# Patient Record
Sex: Female | Born: 1945 | Race: White | Hispanic: No | Marital: Married | Smoking: Former smoker
Health system: Southern US, Community
[De-identification: ages and names within clinical notes are randomized; demographics above are authoritative.]

## PROBLEM LIST (undated history)

## (undated) DIAGNOSIS — E785 Hyperlipidemia, unspecified: Secondary | ICD-10-CM

## (undated) DIAGNOSIS — M899 Disorder of bone, unspecified: Secondary | ICD-10-CM

## (undated) DIAGNOSIS — F411 Generalized anxiety disorder: Secondary | ICD-10-CM

## (undated) DIAGNOSIS — K3189 Other diseases of stomach and duodenum: Secondary | ICD-10-CM

## (undated) DIAGNOSIS — Z79899 Other long term (current) drug therapy: Secondary | ICD-10-CM

## (undated) DIAGNOSIS — M949 Disorder of cartilage, unspecified: Secondary | ICD-10-CM

## (undated) DIAGNOSIS — R030 Elevated blood-pressure reading, without diagnosis of hypertension: Secondary | ICD-10-CM

## (undated) DIAGNOSIS — R1013 Epigastric pain: Secondary | ICD-10-CM

## (undated) DIAGNOSIS — I498 Other specified cardiac arrhythmias: Secondary | ICD-10-CM

## (undated) HISTORY — DX: Hyperlipidemia, unspecified: E78.5

## (undated) HISTORY — PX: CATARACT EXTRACTION, BILATERAL: SHX1313

## (undated) HISTORY — DX: Disorder of cartilage, unspecified: M94.9

## (undated) HISTORY — DX: Disorder of bone, unspecified: M89.9

## (undated) HISTORY — DX: Other diseases of stomach and duodenum: K31.89

## (undated) HISTORY — DX: Other specified cardiac arrhythmias: I49.8

## (undated) HISTORY — DX: Other long term (current) drug therapy: Z79.899

## (undated) HISTORY — PX: TONSILLECTOMY: SUR1361

## (undated) HISTORY — DX: Epigastric pain: R10.13

## (undated) HISTORY — PX: LAPAROSCOPY FOR ECTOPIC PREGNANCY: SUR765

## (undated) HISTORY — DX: Elevated blood-pressure reading, without diagnosis of hypertension: R03.0

## (undated) HISTORY — DX: Generalized anxiety disorder: F41.1

---

## 2003-09-21 LAB — HM SIGMOIDOSCOPY: HM Sigmoidoscopy: NORMAL

## 2006-09-20 ENCOUNTER — Encounter: Payer: Self-pay | Admitting: Family Medicine

## 2006-09-20 LAB — CONVERTED CEMR LAB: Pap Smear: NORMAL

## 2006-09-21 ENCOUNTER — Ambulatory Visit: Payer: Self-pay | Admitting: Internal Medicine

## 2006-10-06 ENCOUNTER — Ambulatory Visit: Payer: Self-pay | Admitting: Family Medicine

## 2006-10-06 ENCOUNTER — Other Ambulatory Visit: Admission: RE | Admit: 2006-10-06 | Discharge: 2006-10-06 | Payer: Self-pay | Admitting: Family Medicine

## 2006-10-06 ENCOUNTER — Encounter: Payer: Self-pay | Admitting: Family Medicine

## 2006-10-10 ENCOUNTER — Ambulatory Visit: Payer: Self-pay | Admitting: Family Medicine

## 2006-10-10 LAB — CONVERTED CEMR LAB
ALT: 24 units/L (ref 0–40)
AST: 26 units/L (ref 0–37)
Albumin: 3.8 g/dL (ref 3.5–5.2)
Alkaline Phosphatase: 31 units/L — ABNORMAL LOW (ref 39–117)
BUN: 12 mg/dL (ref 6–23)
Calcium: 9.5 mg/dL (ref 8.4–10.5)
Cholesterol: 168 mg/dL (ref 0–200)
GFR calc Af Amer: 65 mL/min
GFR calc non Af Amer: 54 mL/min
Potassium: 3.4 meq/L — ABNORMAL LOW (ref 3.5–5.1)
Total Bilirubin: 0.9 mg/dL (ref 0.3–1.2)
Total Protein: 6.8 g/dL (ref 6.0–8.3)

## 2006-10-13 ENCOUNTER — Ambulatory Visit: Payer: Self-pay | Admitting: Family Medicine

## 2006-10-25 ENCOUNTER — Ambulatory Visit: Payer: Self-pay | Admitting: Family Medicine

## 2007-04-11 ENCOUNTER — Ambulatory Visit: Payer: Self-pay | Admitting: Family Medicine

## 2007-06-30 ENCOUNTER — Ambulatory Visit: Payer: Self-pay | Admitting: Family Medicine

## 2007-11-19 HISTORY — PX: CARDIOVASCULAR STRESS TEST: SHX262

## 2007-11-19 HISTORY — PX: DOPPLER ECHOCARDIOGRAPHY: SHX263

## 2007-11-21 ENCOUNTER — Telehealth: Payer: Self-pay | Admitting: Family Medicine

## 2007-11-21 ENCOUNTER — Emergency Department: Payer: Self-pay | Admitting: Emergency Medicine

## 2007-11-21 ENCOUNTER — Other Ambulatory Visit: Payer: Self-pay

## 2007-11-23 ENCOUNTER — Encounter: Payer: Self-pay | Admitting: Family Medicine

## 2007-11-24 ENCOUNTER — Encounter: Payer: Self-pay | Admitting: Family Medicine

## 2007-11-25 ENCOUNTER — Encounter: Payer: Self-pay | Admitting: Family Medicine

## 2007-11-28 ENCOUNTER — Encounter: Payer: Self-pay | Admitting: Family Medicine

## 2007-12-04 ENCOUNTER — Ambulatory Visit: Payer: Self-pay | Admitting: Family Medicine

## 2007-12-06 ENCOUNTER — Telehealth: Payer: Self-pay | Admitting: Family Medicine

## 2007-12-08 ENCOUNTER — Telehealth: Payer: Self-pay | Admitting: Family Medicine

## 2007-12-08 ENCOUNTER — Encounter: Payer: Self-pay | Admitting: Family Medicine

## 2007-12-08 DIAGNOSIS — E782 Mixed hyperlipidemia: Secondary | ICD-10-CM | POA: Insufficient documentation

## 2007-12-08 DIAGNOSIS — I10 Essential (primary) hypertension: Secondary | ICD-10-CM | POA: Insufficient documentation

## 2007-12-08 DIAGNOSIS — F411 Generalized anxiety disorder: Secondary | ICD-10-CM | POA: Insufficient documentation

## 2007-12-08 DIAGNOSIS — M8580 Other specified disorders of bone density and structure, unspecified site: Secondary | ICD-10-CM | POA: Insufficient documentation

## 2007-12-12 ENCOUNTER — Ambulatory Visit: Payer: Self-pay | Admitting: Family Medicine

## 2007-12-13 ENCOUNTER — Telehealth: Payer: Self-pay | Admitting: Family Medicine

## 2007-12-15 ENCOUNTER — Encounter: Payer: Self-pay | Admitting: Family Medicine

## 2007-12-21 LAB — CONVERTED CEMR LAB
Dopamine 24 Hr Urine: 114 mcg/24hr (ref <500)
Metaneph Total, Ur: 366 ug/24hr (ref 224–832)
Metanephrines, Ur: 117 (ref 90–315)
Norepinephrine 24 Hr Urine: 48 mcg/24hr (ref <80)
Normetanephrine, 24H Ur: 249 (ref 122–676)

## 2007-12-25 ENCOUNTER — Ambulatory Visit: Payer: Self-pay | Admitting: Family Medicine

## 2007-12-25 LAB — CONVERTED CEMR LAB: LDL Cholesterol: 100 mg/dL

## 2007-12-27 ENCOUNTER — Telehealth: Payer: Self-pay | Admitting: Family Medicine

## 2008-01-25 ENCOUNTER — Encounter: Payer: Self-pay | Admitting: Family Medicine

## 2008-02-13 ENCOUNTER — Ambulatory Visit: Payer: Self-pay | Admitting: Family Medicine

## 2008-02-14 ENCOUNTER — Telehealth: Payer: Self-pay | Admitting: Family Medicine

## 2008-02-14 ENCOUNTER — Encounter: Payer: Self-pay | Admitting: Family Medicine

## 2008-02-14 LAB — CONVERTED CEMR LAB
Albumin: 4.6 g/dL (ref 3.5–5.2)
Basophils Absolute: 0 10*3/uL (ref 0.0–0.1)
CO2: 26 meq/L (ref 19–32)
Calcium: 9.9 mg/dL (ref 8.4–10.5)
Creatinine, Ser: 0.83 mg/dL (ref 0.40–1.20)
Eosinophils Relative: 0 % (ref 0–5)
Glucose, Bld: 89 mg/dL (ref 70–99)
HCT: 42.5 % (ref 36.0–46.0)
Hemoglobin: 14.4 g/dL (ref 12.0–15.0)
Lymphocytes Relative: 10 % — ABNORMAL LOW (ref 12–46)
Lymphs Abs: 0.8 10*3/uL (ref 0.7–4.0)
MCV: 83 fL (ref 78.0–100.0)
Monocytes Absolute: 0.5 10*3/uL (ref 0.1–1.0)
RDW: 13.2 % (ref 11.5–15.5)
Total Protein: 7.5 g/dL (ref 6.0–8.3)

## 2008-02-15 ENCOUNTER — Encounter: Payer: Self-pay | Admitting: Family Medicine

## 2008-02-21 ENCOUNTER — Telehealth: Payer: Self-pay | Admitting: Family Medicine

## 2008-02-26 ENCOUNTER — Ambulatory Visit: Payer: Self-pay | Admitting: Family Medicine

## 2008-02-28 LAB — CONVERTED CEMR LAB
Folate: 16.1 ng/mL
Vitamin B-12: 238 pg/mL (ref 211–911)

## 2008-03-13 ENCOUNTER — Telehealth: Payer: Self-pay | Admitting: Family Medicine

## 2008-03-13 ENCOUNTER — Encounter: Payer: Self-pay | Admitting: Family Medicine

## 2008-03-13 ENCOUNTER — Telehealth (INDEPENDENT_AMBULATORY_CARE_PROVIDER_SITE_OTHER): Payer: Self-pay | Admitting: *Deleted

## 2008-03-14 ENCOUNTER — Ambulatory Visit: Payer: Self-pay | Admitting: Family Medicine

## 2008-03-14 DIAGNOSIS — E538 Deficiency of other specified B group vitamins: Secondary | ICD-10-CM | POA: Insufficient documentation

## 2008-03-19 ENCOUNTER — Encounter (INDEPENDENT_AMBULATORY_CARE_PROVIDER_SITE_OTHER): Payer: Self-pay | Admitting: *Deleted

## 2008-04-17 ENCOUNTER — Ambulatory Visit: Payer: Self-pay | Admitting: Family Medicine

## 2008-05-21 ENCOUNTER — Ambulatory Visit: Payer: Self-pay | Admitting: Family Medicine

## 2008-06-19 ENCOUNTER — Ambulatory Visit: Payer: Self-pay | Admitting: Family Medicine

## 2008-06-24 ENCOUNTER — Ambulatory Visit: Payer: Self-pay | Admitting: Family Medicine

## 2008-07-29 ENCOUNTER — Ambulatory Visit: Payer: Self-pay | Admitting: Family Medicine

## 2008-10-02 ENCOUNTER — Ambulatory Visit: Payer: Self-pay | Admitting: Family Medicine

## 2009-02-10 ENCOUNTER — Telehealth: Payer: Self-pay | Admitting: Family Medicine

## 2009-04-25 ENCOUNTER — Other Ambulatory Visit: Admission: RE | Admit: 2009-04-25 | Discharge: 2009-04-25 | Payer: Self-pay | Admitting: Family Medicine

## 2009-04-25 ENCOUNTER — Ambulatory Visit: Payer: Self-pay | Admitting: Family Medicine

## 2009-04-29 ENCOUNTER — Ambulatory Visit: Payer: Self-pay | Admitting: Family Medicine

## 2009-05-02 ENCOUNTER — Ambulatory Visit: Payer: Self-pay | Admitting: Family Medicine

## 2009-05-05 ENCOUNTER — Encounter (INDEPENDENT_AMBULATORY_CARE_PROVIDER_SITE_OTHER): Payer: Self-pay | Admitting: *Deleted

## 2009-05-06 LAB — CONVERTED CEMR LAB
ALT: 16 units/L (ref 0–35)
Alkaline Phosphatase: 20 units/L — ABNORMAL LOW (ref 39–117)
Basophils Absolute: 0 10*3/uL (ref 0.0–0.1)
Basophils Relative: 0 % (ref 0–1)
Glucose, Bld: 95 mg/dL (ref 70–99)
LDL Cholesterol: 135 mg/dL — ABNORMAL HIGH (ref 0–99)
MCHC: 33 g/dL (ref 30.0–36.0)
Neutro Abs: 2.8 10*3/uL (ref 1.7–7.7)
Neutrophils Relative %: 57 % (ref 43–77)
RDW: 13.9 % (ref 11.5–15.5)
Sodium: 143 meq/L (ref 135–145)
Total Bilirubin: 0.8 mg/dL (ref 0.3–1.2)
Total CHOL/HDL Ratio: 3.9
Total Protein: 7 g/dL (ref 6.0–8.3)
Triglycerides: 158 mg/dL — ABNORMAL HIGH (ref <150)
VLDL: 32 mg/dL (ref 0–40)
Vitamin B-12: 898 pg/mL (ref 211–911)

## 2009-05-07 ENCOUNTER — Encounter (INDEPENDENT_AMBULATORY_CARE_PROVIDER_SITE_OTHER): Payer: Self-pay | Admitting: *Deleted

## 2009-05-14 LAB — HM MAMMOGRAPHY: HM Mammogram: NORMAL

## 2010-05-26 ENCOUNTER — Telehealth (INDEPENDENT_AMBULATORY_CARE_PROVIDER_SITE_OTHER): Payer: Self-pay | Admitting: *Deleted

## 2010-05-26 ENCOUNTER — Ambulatory Visit: Payer: Self-pay | Admitting: Family Medicine

## 2010-05-26 LAB — CONVERTED CEMR LAB
ALT: 12 units/L (ref 0–35)
AST: 21 units/L (ref 0–37)
Alkaline Phosphatase: 20 units/L — ABNORMAL LOW (ref 39–117)
BUN: 15 mg/dL (ref 6–23)
Basophils Absolute: 0 10*3/uL (ref 0.0–0.1)
Basophils Relative: 0 % (ref 0–1)
Creatinine, Ser: 0.87 mg/dL (ref 0.40–1.20)
Eosinophils Absolute: 0.1 10*3/uL (ref 0.0–0.7)
Eosinophils Relative: 2 % (ref 0–5)
HCT: 40.4 % (ref 36.0–46.0)
HDL: 54 mg/dL (ref >39)
Hemoglobin: 13.4 g/dL (ref 12.0–15.0)
MCHC: 33.2 g/dL (ref 30.0–36.0)
MCV: 84 fL (ref 78.0–100.0)
Monocytes Absolute: 0.5 10*3/uL (ref 0.1–1.0)
Neutro Abs: 3.1 10*3/uL (ref 1.7–7.7)
RDW: 13.6 % (ref 11.5–15.5)
TSH: 2.102 microintl units/mL (ref 0.350–4.500)
Total Bilirubin: 1 mg/dL (ref 0.3–1.2)
Total CHOL/HDL Ratio: 3.3
VLDL: 26 mg/dL (ref 0–40)
Vitamin B-12: 698 pg/mL (ref 211–911)

## 2010-05-29 ENCOUNTER — Ambulatory Visit: Payer: Self-pay | Admitting: Family Medicine

## 2010-05-29 LAB — HM PAP SMEAR

## 2010-09-01 ENCOUNTER — Ambulatory Visit: Payer: Self-pay | Admitting: Unknown Physician Specialty

## 2010-09-01 ENCOUNTER — Encounter: Payer: Self-pay | Admitting: Family Medicine

## 2010-09-02 LAB — PATHOLOGY REPORT

## 2010-10-20 NOTE — Progress Notes (Signed)
----   Converted from flag ---- ---- 05/23/2010 11:39 PM, Kerby Nora MD wrote: CMET, lipids, cbc, vit B12, TSh Dx 401.1, 780.79  ---- 05/21/2010 10:26 AM, Liane Comber CMA (AAMA) wrote: Peri [facility] Morning! This pt is scheduled for cpx labs Tuesday, which labs to draw and dx codes to use? Thanks Tasha ------------------------------

## 2010-10-20 NOTE — Assessment & Plan Note (Signed)
Summary: CPX/RBH   Vital Signs:  Patient profile:   65 year old female Height:      64 inches Weight:      155 pounds BMI:     26.70 Temp:     98.6 degrees F oral Pulse rate:   76 / minute Pulse rhythm:   regular BP sitting:   150 / 84  (left arm) Cuff size:   regular  Vitals Entered By: Lowella Petties CMA (May 29, 2010 2:04 PM) CC: Check up, Hypertension Management     Last PAP Date 05/29/2010 Last PAP Result DVE only, PAP q2-3 years   History of Present Illness: The patient is here for annual wellness exam and preventative care.     She has the following chronic issues:  HTN:On lisinopril /HCTZ .Marland Kitchenat home BPs...running 120/70s.  She states she is anxious today.  High cholesterol, much improved since last OV. Eating right and regualr exercise. Taking simvastin daily. Sister with CAD at young age 39s.  Anxiety/depression: Has been able to wean off sertraline in 04/2010. HAs appt with pshychiatry next month. HAs been doing okaty off this med..some quick to anger issues with husband.    Hypertension History:      Positive major cardiovascular risk factors include female age 64 years old or older, hyperlipidemia, and hypertension.  Negative major cardiovascular risk factors include non-tobacco-user status.     Current Medications (verified): 1)  Aspirin Childrens 81 Mg Chew (Aspirin) .... Take 1 Tablet By Mouth Once A Day 2)  Simvastatin 40 Mg Tabs (Simvastatin) .... Take 1 Tablet By Mouth At Bedtime 3)  Zestoretic 20-12.5 Mg Tabs (Lisinopril-Hydrochlorothiazide) .... Take 2 Tablets By Mouth Once Daily 4)  Vitamin B-12 2500 Mcg Subl (Cyanocobalamin) .... Take One By Mouth Daily 5)  Fish Oil 1000 Mg Caps (Omega-3 Fatty Acids) .... Take One By Mouth Daily 6)  Oscal 500/200 D-3 500-200 Mg-Unit Tabs (Calcium-Vitamin D) .... Take One By Mouth Daily  Allergies: No Known Drug Allergies  Review of Systems General:  Denies fatigue and fever. CV:  Denies chest pain or  discomfort. Resp:  Denies shortness of breath. GI:  Denies abdominal pain, bloody stools, constipation, and diarrhea. GU:  Denies abnormal vaginal bleeding and dysuria. Derm:  Denies lesion(s).  Physical Exam  General:  Well-developed,well-nourished,in no acute distress; alert,appropriate and cooperative throughout examination Eyes:  No corneal or conjunctival inflammation noted. EOMI. Perrla. Funduscopic exam benign, without hemorrhages, exudates or papilledema. Vision grossly normal. Ears:  External ear exam shows no significant lesions or deformities.  Otoscopic examination reveals clear canals, tympanic membranes are intact bilaterally without bulging, retraction, inflammation or discharge. Hearing is grossly normal bilaterally. Nose:  External nasal examination shows no deformity or inflammation. Nasal mucosa are pink and moist without lesions or exudates. Mouth:  Oral mucosa and oropharynx without lesions or exudates.  Teeth in good repair. Neck:  no carotid bruit or thyromegaly no cervical or supraclavicular lymphadenopathy  Chest Wall:  No deformities, masses, or tenderness noted. Breasts:  No mass, nodules, thickening, tenderness, bulging, retraction, inflamation, nipple discharge or skin changes noted.   Lungs:  Normal respiratory effort, chest expands symmetrically. Lungs are clear to auscultation, no crackles or wheezes. Heart:  Normal rate and regular rhythm. S1 and S2 normal without gallop, murmur, click, rub or other extra sounds. Abdomen:  Bowel sounds positive,abdomen soft and non-tender without masses, organomegaly or hernias noted. Genitalia:  Pelvic Exam:        External: normal female genitalia without  lesions or masses        Vagina: normal without lesions or masses        Cervix: normal without lesions or masses        Adnexa: normal bimanual exam without masses or fullness        Uterus: normal by palpation        Pap smear: not performed Msk:  No deformity or  scoliosis noted of thoracic or lumbar spine.   Pulses:  R and L posterior tibial pulses are full and equal bilaterally  Extremities:  no edema Neurologic:  No cranial nerve deficits noted. Station and gait are normal. Plantar reflexes are down-going bilaterally. DTRs are symmetrical throughout. Sensory, motor and coordinative functions appear intact. Skin:  Intact without suspicious lesions or rashes Psych:  Oriented X3, memory intact for recent and remote, normally interactive, good eye contact, and slightly anxious.     Impression & Recommendations:  Problem # 1:  WELL WOMAN (ICD-V70.0) The patient's preventative maintenance and recommended screening tests for an annual wellness exam were reviewed in full today. Brought up to date unless services declined.  Counselled on the importance of diet, exercise, and its role in overall health and mortality. The patient's FH and SH was reviewed, including their home life, tobacco status, and drug and alcohol status.     Problem # 2:  ROUTINE GYNECOLOGICAL EXAMINATION (ICD-V72.31) DVE nml , PAP every 2-3 years..none this year.   Problem # 3:  HYPERTENSION (ICD-401.9)  Well controlled at home...has white coat HTN elevations...continue current med. Follow at home.  Her updated medication list for this problem includes:    Zestoretic 20-12.5 Mg Tabs (Lisinopril-hydrochlorothiazide) .Marland Kitchen... Take 2 tablets by mouth once daily  BP today: 150/84 Prior BP: 126/82 (04/25/2009)  10 Yr Risk Heart Disease: 9 %  Labs Reviewed: K+: 4.1 (04/29/2009) Creat: : 1.00 (04/29/2009)   Chol: 224 (04/29/2009)   HDL: 54 (05/29/2010)   LDL: 96 (05/29/2010)   TG: 158 (04/29/2009)  Problem # 4:  OSTEOPENIA (ICD-733.90) Due for repeat DXA.  The following medications were removed from the medication list:    Caltrate 600+d Plus 600-400 Mg-unit Tabs (Calcium carbonate-vit d-min) .Marland Kitchen... Take 1 tablet by mouth once a day Her updated medication list for this problem  includes:    Oscal 500/200 D-3 500-200 Mg-unit Tabs (Calcium-vitamin d) .Marland Kitchen... Take one by mouth daily  Orders: Radiology Referral (Radiology)  Problem # 5:  HYPERLIPIDEMIA (ICD-272.4)  Well controlled. Continue current medication.  Her updated medication list for this problem includes:    Simvastatin 40 Mg Tabs (Simvastatin) .Marland Kitchen... Take 1 tablet by mouth at bedtime  Labs Reviewed: SGOT: 17 (04/29/2009)   SGPT: 16 (04/29/2009)  10 Yr Risk Heart Disease: 9 %   HDL:54 (05/29/2010), 57 (04/29/2009)  LDL:96 (05/29/2010), 135 (16/06/9603)  Chol:224 (04/29/2009), 175 (12/25/2007)  Trig:158 (04/29/2009), 100 (12/25/2007)  Problem # 6:  ANXIETY (ICD-300.00) Stable control off medicaiton. Sees Pshychiatry. The following medications were removed from the medication list:    Sertraline Hcl 100 Mg Tabs (Sertraline hcl) .Marland Kitchen... Takes 150 mg. once daily  Complete Medication List: 1)  Aspirin Childrens 81 Mg Chew (Aspirin) .... Take 1 tablet by mouth once a day 2)  Simvastatin 40 Mg Tabs (Simvastatin) .... Take 1 tablet by mouth at bedtime 3)  Zestoretic 20-12.5 Mg Tabs (Lisinopril-hydrochlorothiazide) .... Take 2 tablets by mouth once daily 4)  Vitamin B-12 2500 Mcg Subl (Cyanocobalamin) .... Take one by mouth daily 5)  Fish Oil 1000  Mg Caps (Omega-3 fatty acids) .... Take one by mouth daily 6)  Oscal 500/200 D-3 500-200 Mg-unit Tabs (Calcium-vitamin d) .... Take one by mouth daily  Other Orders: Admin 1st Vaccine (16109) Flu Vaccine 90yrs + [phone]) Gastroenterology Referral (GI)  Hypertension Assessment/Plan:      The patient's hypertensive risk group is category B: At least one risk factor (excluding diabetes) with no target organ damage.  Her calculated 10 year risk of coronary heart disease is 9 %.  Today's blood pressure is 150/84.    Patient Instructions: 1)  Referral Appointment Information 2)  Day/Date: 3)  Time: 4)  Place/MD: 5)  Address: 6)  Phone/Fax: 7)  Patient given  appointment information. Information/Orders faxed/mailed.  8)  Please schedule a follow-up appointment in 1 year.   Prior Medications (reviewed today): ASPIRIN CHILDRENS 81 MG CHEW (ASPIRIN) Take 1 tablet by mouth once a day SIMVASTATIN 40 MG TABS (SIMVASTATIN) Take 1 tablet by mouth at bedtime ZESTORETIC 20-12.5 MG TABS (LISINOPRIL-HYDROCHLOROTHIAZIDE) Take 2 tablets by mouth once daily VITAMIN B-12 2500 MCG SUBL (CYANOCOBALAMIN) take one by mouth daily FISH OIL 1000 MG CAPS (OMEGA-3 FATTY ACIDS) take one by mouth daily OSCAL 500/200 D-3 500-200 MG-UNIT TABS (CALCIUM-VITAMIN D) take one by mouth daily Current Allergies: No known allergies   Herpes Zoster Next Due:  Refused Last HDL:  57 (04/29/2009 10:04:00 PM) HDL Result Date:  05/29/2010 HDL Result:  54 HDL Next Due:  1 yr Last LDL:  135 (04/29/2009 10:04:00 PM) LDL Result Date:  05/29/2010 LDL Result:  96 LDL Next Due:  1 yr Last Hemoccult Result: normal (05/05/2009 2:55:20 PM) Hemoccult Next Due:  Not Indicated Last PAP:  normal (04/25/2009 5:48:58 PM) PAP Result Date:  05/29/2010 PAP Result:  DVE only, PAP q2-3 years PAP Next Due:  1 yr   Flu Vaccine Consent Questions     Do you have a history of severe allergic reactions to this vaccine? no    Any prior history of allergic reactions to egg and/or gelatin? no    Do you have a sensitivity to the preservative Thimersol? no    Do you have a past history of Guillan-Barre Syndrome? no    Do you currently have an acute febrile illness? no    Have you ever had a severe reaction to latex? no    Vaccine information given and explained to patient? yes    Are you currently pregnant? no    Lot Number:[id number]   Exp Date:03/20/2011   Site Given  Left Deltoid IM  PAP Result Date:  05/29/2010 PAP Result:  DVE only, PAP q2-3 years PAP Next Due:  1 yr   .lbflu

## 2010-10-22 NOTE — Procedures (Signed)
Summary: Colonoscopy by Dr.Robert [facility]  Colonoscopy by Dr.Robert [facility]   Imported By: Beau Fanny 09/04/2010 16:33:59  _____________________________________________________________________  External Attachment:    Type:   Image     Comment:   External Document  Appended Document: Orders Update    Clinical Lists Changes  Observations: Added new observation of LST COLON DT: 09/04/2010 (09/04/2010 16:48) Added new observation of COLONNXTDUE: 09/04/2020 (09/04/2010 16:48) Added new observation of COLONOSCOPY: awating path, polyps (09/04/2010 16:48)      Colonoscopy Result Date:  09/04/2010 Colonoscopy Result:  awating path, polyps

## 2011-02-02 NOTE — Assessment & Plan Note (Signed)
[facility]                                 ON-CALL NOTE   Tammy Schmidt, Tammy Schmidt                      MRN:          [id number]  DATE:02/25/2008                            DOB:          [birth year]    PHONE NUMBER:  [phone].   Regular doctor is Dr. Ermalene Searing and Dr. Milinda Antis on-call.   CHIEF COMPLAINT:  Insomnia and anxiety.   The patient states she is on 5 mg of Ambien, and it is not helping her  sleep much.  She cannot even take naps during the day.  She has been  diagnosed with anxiety.  She is taking some Zoloft as well.  She has had  ongoing medical issues lately, including some palpitations for which she  has seen a cardiologist at [facility].  For the past 2 weeks, she has  had some numbness in her arm and her leg, which she does not think is  emergent, but is not sure what is causing that.  She thinks something  medical may be causing her anxiety, but she is not sure.  She wondered  if calling this number could get her a sooner appointment with Dr.  Ermalene Searing.  I told her tonight if she wanted to try taking 10 mg  instead  of 5 mg of Ambien, she could try it once and be very cautious as this is  sedating and can make her dizzy.  I encouraged her to seek care if her  numbness in her leg or arm returns or worsens, or if she develops severe  headache or any other symptoms.  She also mentions that her appetite is  very low, and she is losing weight.  I encouraged her to try to eat as  healthy as possible and to call the office first thing in the morning  when it opens to get an appointment with Dr. Ermalene Searing to discuss these  issues.  In the meantime, if her symptoms worsen or if she becomes much  more anxious, she is going to go to [facility] for evaluation in the ER.     Tammy A. Tower, MD  Electronically Signed    MAT/MedQ  DD: 02/25/2008  DT: 02/26/2008  Job #: [id number]

## 2011-02-05 NOTE — Assessment & Plan Note (Signed)
[facility]                           [place] OFFICE NOTE   Tammy Schmidt, Tammy Schmidt                      MRN:          [id number]  DATE:09/21/2006                            DOB:          25-Jul-1946    NEW PATIENT HISTORY AND PHYSICAL   CHIEF COMPLAINT:  A 65 year old white female here to establish new  doctor.   HISTORY OF PRESENT ILLNESS:  Tammy Schmidt recently moved approximately 1  month ago from [place].  She is here to be set up with a new doctor and  address the following chronic problems:  1. Hypertension, well-controlled:  She states that her blood pressure      is well controlled at home.  She brings a blood pressure recording      of the measurements, showing blood pressures ranging from 119-      131/64-77.  She states that her blood pressure is always elevated      at doctors' visits.  She has done a home blood pressure monitor      that has diagnosed her with classic white coat hypertension.  She      continues to take Hyzaar 100/25 mg daily to keep her blood pressure      well controlled. She is requesting a refill of Hyzaar today.  2. Hypercholesterolemia:  She states this has been checked in the last      3-4 months.  At that time, she states her bad cholesterol was      slightly elevated and so she was increased on her dose of      simvastatin from 40 to 80 mg.  She states that she began having      body aches and so went back down to the 40 mg and has been doing      fine.  She is unsure of what her exact cholesterol measurements are      at this time.  She is open to trying additional cholesterol      medications if needed.  3. Anxiety:  She states that she has been on sertraline for several      years but her anxiety has gotten recently worse secondary to her      husband losing his job and to the recent move.  Her previous doctor      did increase her sertraline from 100 to 150 mg daily at the last      visit 3-4 months  ago.  She states that she has had some improvement      but still continues to have off and on anxiety.  She is requesting      a refill today.   PAST MEDICAL HISTORY:  1. Hypertension.  2. White coat syndrome.  3. Hypercholesterolemia.  4. Anxiety.   HOSPITALIZATIONS, SURGERIES, PROCEDURES:  1. A 24-hour blood pressure monitor.  2. 1980s, exploratory laparotomy for ectopic pregnancy and removal of      fallopian tube.  3. Tonsillectomy.  4. Mammogram, January 2007.  5. Pap smear, December 2006.  6. Flexible sigmoidoscopy negative in 2005.   ALLERGIES:  None.   MEDICATIONS:  1. Hyzaar 100/25 one p.o. daily.  2. Simvastatin 80 mg 1/2 tablet p.o. daily.  3. Sertraline 100 mg 1-1/2 tablets p.o. daily.  4. Caltrate 600 mg with vitamin D 2 tablets daily.  5. Omega-3 fatty acids 1200 mg daily.  6. Aspirin 81 mg daily.   FAMILY HISTORY:  Father deceased at age 39 with MI.  Mother deceased at  age 56 with MI.  She has 1 brother with hypertension and high  cholesterol.  She has sisters, 1 of whom at age 45 had coronary artery  disease and stents.  Another sister passed away at age 6 from sudden  death with diabetes, hypertension, and likely MI.  She also had 1 other  sister who passed away from Hodgkin's disease.  There is no other cancer  that runs in the family.   SOCIAL HISTORY:  She is a retired Film/video editor.  She has been married  for 42 years, happily.  She has 1 child who has increased cholesterol  but no other health problems.  She is trying to restart walking  regularly.  Her exercise has dwindled recently secondary to the move and  recent stress but she is trying to get back in to it.  She does get some  fruits and vegetables and is working on improving her diet.  She tries  to get water.   PHYSICAL EXAM:  VITAL SIGNS:  Height 64-1/2 inches.  Weight 156, making  BMI 26.  Blood pressure 176/90.  Pulse 84.  Temperature 98.2.  GENERAL:  Healthy appearing, thin female in  no apparent distress.  HEENT:  PERRLA.  Extraocular muscles intact.  Oropharynx clear.  Tympanic membranes clear.  Nares clear.  No thyromegaly.  No  lymphadenopathy, supraclavicular or cervical.  No carotid bruit.  CARDIOVASCULAR:  Regular rate and rhythm.  No murmurs, rubs, or gallops.  Normal PMI.  2+ peripheral pulses.  LUNGS:  Clear to auscultation bilaterally.  No wheezes, rales, or  rhonchi.  ABDOMEN:  Soft, nontender, normoactive bowel sounds.  No  hepatosplenomegaly.  MUSCULOSKELETAL:  Strength 5/5 in the upper and lower extremities.  NEURO:  Cranial nerves II through XII grossly intact.  Sensation intact  in upper and lower extremities.  Reflexes, patellar 2+.   ASSESSMENT AND PLAN:  1. Hypertension, well controlled:  She was given a refill of Hyzaar      100/25 mg p.o. daily.  She does likely have white coat syndrome.  I      will verify this with her records.  2. High cholesterol:  I will obtain her last cholesterol measurement      to determine how elevated it was.  We can always consider trying      Zetia if needed to improve her cholesterol without increasing      simvastatin again.  3. Anxiety, moderate control:  She will continue on sertraline 150 mg      daily.  She was given a refill of this.  She is not interested in      any counseling at this point in time.  4. Prevention:  She is due for a mammogram and this will be scheduled.      She was also      scheduled for a Pap smear and breast examination.  She is up to      date with colon cancer screening as well as vaccines.     Tammy Nora, MD  Electronically Signed  AB/MedQ  DD: 09/21/2006  DT: 09/21/2006  Job #: [id number]

## 2011-06-07 ENCOUNTER — Telehealth: Payer: Self-pay | Admitting: Family Medicine

## 2011-06-07 ENCOUNTER — Other Ambulatory Visit (INDEPENDENT_AMBULATORY_CARE_PROVIDER_SITE_OTHER): Payer: PRIVATE HEALTH INSURANCE

## 2011-06-07 DIAGNOSIS — M949 Disorder of cartilage, unspecified: Secondary | ICD-10-CM

## 2011-06-07 DIAGNOSIS — Z0000 Encounter for general adult medical examination without abnormal findings: Secondary | ICD-10-CM

## 2011-06-07 DIAGNOSIS — M899 Disorder of bone, unspecified: Secondary | ICD-10-CM

## 2011-06-07 LAB — CBC WITH DIFFERENTIAL/PLATELET
Basophils Absolute: 0 10*3/uL (ref 0.0–0.1)
Eosinophils Relative: 1 % (ref 0.0–5.0)
HCT: 40.4 % (ref 36.0–46.0)
Lymphocytes Relative: 25.1 % (ref 12.0–46.0)
Monocytes Relative: 7.2 % (ref 3.0–12.0)
Neutrophils Relative %: 66.3 % (ref 43.0–77.0)
Platelets: 214 10*3/uL (ref 150.0–400.0)
RDW: 14 % (ref 11.5–14.6)
WBC: 6.3 10*3/uL (ref 4.5–10.5)

## 2011-06-07 LAB — BASIC METABOLIC PANEL
CO2: 29 mEq/L (ref 19–32)
Chloride: 106 mEq/L (ref 96–112)
Glucose, Bld: 99 mg/dL (ref 70–99)
Potassium: 3.9 mEq/L (ref 3.5–5.1)
Sodium: 142 mEq/L (ref 135–145)

## 2011-06-07 LAB — HEPATIC FUNCTION PANEL
AST: 22 U/L (ref 0–37)
Albumin: 4.1 g/dL (ref 3.5–5.2)
Alkaline Phosphatase: 21 U/L — ABNORMAL LOW (ref 39–117)
Bilirubin, Direct: 0.1 mg/dL (ref 0.0–0.3)
Total Protein: 7 g/dL (ref 6.0–8.3)

## 2011-06-07 LAB — LIPID PANEL
LDL Cholesterol: 78 mg/dL (ref 0–99)
Total CHOL/HDL Ratio: 3

## 2011-06-07 NOTE — Telephone Encounter (Signed)
I t appears labs have already ben place for this pt... Let me know if I need to add anything else.

## 2011-06-08 ENCOUNTER — Encounter: Payer: Self-pay | Admitting: Family Medicine

## 2011-06-08 LAB — VITAMIN D 25 HYDROXY (VIT D DEFICIENCY, FRACTURES): Vit D, 25-Hydroxy: 50 ng/mL (ref 30–89)

## 2011-06-10 ENCOUNTER — Encounter: Payer: Self-pay | Admitting: Family Medicine

## 2011-06-10 ENCOUNTER — Ambulatory Visit (INDEPENDENT_AMBULATORY_CARE_PROVIDER_SITE_OTHER): Payer: PRIVATE HEALTH INSURANCE | Admitting: Family Medicine

## 2011-06-10 VITALS — BP 130/80 | HR 81 | Temp 98.4°F | Ht 65.5 in | Wt 161.8 lb

## 2011-06-10 DIAGNOSIS — F411 Generalized anxiety disorder: Secondary | ICD-10-CM

## 2011-06-10 DIAGNOSIS — M899 Disorder of bone, unspecified: Secondary | ICD-10-CM

## 2011-06-10 DIAGNOSIS — Z23 Encounter for immunization: Secondary | ICD-10-CM

## 2011-06-10 DIAGNOSIS — M949 Disorder of cartilage, unspecified: Secondary | ICD-10-CM

## 2011-06-10 DIAGNOSIS — Z1231 Encounter for screening mammogram for malignant neoplasm of breast: Secondary | ICD-10-CM

## 2011-06-10 DIAGNOSIS — I10 Essential (primary) hypertension: Secondary | ICD-10-CM

## 2011-06-10 DIAGNOSIS — E785 Hyperlipidemia, unspecified: Secondary | ICD-10-CM

## 2011-06-10 MED ORDER — SIMVASTATIN 40 MG PO TABS: 40.0000 mg | ORAL_TABLET | Freq: Every day | ORAL | Status: DC

## 2011-06-10 MED ORDER — SERTRALINE HCL 50 MG PO TABS: 50.0000 mg | ORAL_TABLET | Freq: Every day | ORAL | Status: DC

## 2011-06-10 MED ORDER — LISINOPRIL-HYDROCHLOROTHIAZIDE 20-12.5 MG PO TABS: 2.0000 | ORAL_TABLET | Freq: Every day | ORAL | Status: DC

## 2011-06-10 NOTE — Progress Notes (Signed)
Subjective:    Patient ID: Tammy Schmidt, female    DOB: 1945/10/21, 65 y.o.   MRN: [id number]  HPI  The patient is here for annual wellness exam and preventative care.    Hypertension:   Well controlled on lisinopril HCTZ.  Using medication without problems or lightheadedness:  Chest pain with exertion: None Edema:None Short of breath:None Average home BPs: checking occ 105/69,  Other issues:  Anxiety: moderate control at this time.She tends to over react in certain situations. She feels like when she was on sertraline she was more steady with reactiond and stress. No longer seeing pshyc.  Elevated Cholesterol: Well controlled on simvastain 40 mg daily Using medications without problems:None Muscle aches: None Other complaints: LFTs are normal.  Osteopenia: nml vit d   Review of Systems  Constitutional: Negative for fever, fatigue and unexpected weight change.  HENT: Negative for ear pain, congestion, sore throat, sneezing, trouble swallowing and sinus pressure.   Eyes: Negative for pain and itching.  Respiratory: Negative for cough, shortness of breath and wheezing.   Cardiovascular: Negative for chest pain, palpitations and leg swelling.  Gastrointestinal: Negative for nausea, abdominal pain, diarrhea, constipation and blood in stool.  Genitourinary: Negative for dysuria, hematuria, vaginal discharge, difficulty urinating and menstrual problem.  Skin: Negative for rash.  Neurological: Negative for syncope, weakness, light-headedness, numbness and headaches.  Psychiatric/Behavioral: Negative for confusion and dysphoric mood. The patient is not nervous/anxious.        Objective:   Physical Exam  Constitutional: Vital signs are normal. She appears well-developed and well-nourished. She is cooperative.  Non-toxic appearance. She does not appear ill. No distress.  HENT:  Head: Normocephalic.  Right Ear: Hearing, tympanic membrane, external ear and ear canal normal.    Left Ear: Hearing, tympanic membrane, external ear and ear canal normal.  Nose: Nose normal.  Eyes: Conjunctivae, EOM and lids are normal. Pupils are equal, round, and reactive to light. No foreign bodies found.  Neck: Trachea normal and normal range of motion. Neck supple. Carotid bruit is not present. No mass and no thyromegaly present.  Cardiovascular: Normal rate, regular rhythm, S1 normal, S2 normal, normal heart sounds and intact distal pulses.  Exam reveals no gallop.   No murmur heard. Pulmonary/Chest: Effort normal and breath sounds normal. No respiratory distress. She has no wheezes. She has no rhonchi. She has no rales.  Abdominal: Soft. Normal appearance and bowel sounds are normal. She exhibits no distension, no fluid wave, no abdominal bruit and no mass. There is no hepatosplenomegaly. There is no tenderness. There is no rebound, no guarding and no CVA tenderness. No hernia.  Genitourinary: Vagina normal and uterus normal. No breast swelling, tenderness, discharge or bleeding. Pelvic exam was performed with patient prone. There is no rash, tenderness or lesion on the right labia. There is no rash, tenderness or lesion on the left labia. Uterus is not enlarged and not tender. Right adnexum displays no mass, no tenderness and no fullness. Left adnexum displays no mass, no tenderness and no fullness.       DVE no pap  Lymphadenopathy:    She has no cervical adenopathy.    She has no axillary adenopathy.  Neurological: She is alert. She has normal strength. No cranial nerve deficit or sensory deficit.  Skin: Skin is warm, dry and intact. No rash noted.  Psychiatric: Her speech is normal and behavior is normal. Judgment normal. Her mood appears not anxious. Cognition and memory are normal. She does not exhibit  a depressed mood.          Assessment & Plan:  CPX: The patient's preventative maintenance and recommended screening tests for an annual wellness exam were reviewed in full  today. Brought up to date unless services declined.  Counselled on the importance of diet, exercise, and its role in overall health and mortality. The patient's FH and SH was reviewed, including their home life, tobacco status, and drug and alcohol status.   Mammo: due will schedule DXA: Last DXA 2011 no change in osteopenia in spine and hip. Record scanned into computer today after review. DVE, PAP: DVE this year, pap next 2013.. On q3 year cycle Colon: polyp 2011.Marland Kitchen Repeat 10 years Vaccines: Discussed shingles vaccine.  Got flu vaccine.

## 2011-06-10 NOTE — Patient Instructions (Addendum)
Check blood pressure daily at different times each day over the next 1-2 weeks.. Then call with the measurements. Restart sertraline at 50 mg daily. Look into shingles vaccine. Continue work on regular exercise and healthy diet.

## 2011-06-15 LAB — HM MAMMOGRAPHY: HM Mammogram: NEGATIVE

## 2011-06-17 ENCOUNTER — Encounter: Payer: Self-pay | Admitting: Family Medicine

## 2011-06-18 ENCOUNTER — Encounter: Payer: Self-pay | Admitting: Family Medicine

## 2012-06-05 ENCOUNTER — Other Ambulatory Visit: Payer: Self-pay | Admitting: Family Medicine

## 2012-06-05 NOTE — Telephone Encounter (Signed)
Received refill request electronically from pharmacy. Patient has an appointment scheduled for 08/07/12. Is it okay to refill medication?

## 2012-06-06 NOTE — Telephone Encounter (Signed)
Okay to fill? 

## 2012-06-20 ENCOUNTER — Other Ambulatory Visit: Payer: Self-pay | Admitting: Family Medicine

## 2012-06-20 NOTE — Telephone Encounter (Signed)
Simvastatin refilled #90 xo has CPX 07/2012.

## 2012-06-23 ENCOUNTER — Other Ambulatory Visit: Payer: Self-pay | Admitting: Family Medicine

## 2012-07-18 ENCOUNTER — Telehealth: Payer: Self-pay | Admitting: Family Medicine

## 2012-07-18 DIAGNOSIS — E785 Hyperlipidemia, unspecified: Secondary | ICD-10-CM

## 2012-07-18 DIAGNOSIS — M899 Disorder of bone, unspecified: Secondary | ICD-10-CM

## 2012-07-18 DIAGNOSIS — E538 Deficiency of other specified B group vitamins: Secondary | ICD-10-CM

## 2012-07-18 NOTE — Telephone Encounter (Signed)
Message copied by Excell Seltzer on Tue Jul 18, 2012  2:01 AM ------      Message from: [place], [place] J      Created: Wed Jul 12, 2012  3:31 PM      Regarding: Lab orders for [place] 10.30.13       Patient is scheduled for CPX labs, please order future labs, Thanks , Camelia Eng

## 2012-07-19 ENCOUNTER — Other Ambulatory Visit (INDEPENDENT_AMBULATORY_CARE_PROVIDER_SITE_OTHER): Payer: Medicare Other

## 2012-07-19 DIAGNOSIS — E538 Deficiency of other specified B group vitamins: Secondary | ICD-10-CM

## 2012-07-19 DIAGNOSIS — M899 Disorder of bone, unspecified: Secondary | ICD-10-CM

## 2012-07-19 DIAGNOSIS — E785 Hyperlipidemia, unspecified: Secondary | ICD-10-CM

## 2012-07-19 LAB — VITAMIN B12: Vitamin B-12: 338 pg/mL (ref 211–911)

## 2012-07-19 LAB — COMPREHENSIVE METABOLIC PANEL
ALT: 24 U/L (ref 0–35)
BUN: 16 mg/dL (ref 6–23)
CO2: 31 mEq/L (ref 19–32)
Calcium: 9.2 mg/dL (ref 8.4–10.5)
Chloride: 104 mEq/L (ref 96–112)
Creatinine, Ser: 0.9 mg/dL (ref 0.4–1.2)
GFR: 65.11 mL/min (ref >60.00)
Glucose, Bld: 91 mg/dL (ref 70–99)
Total Bilirubin: 0.8 mg/dL (ref 0.3–1.2)

## 2012-07-19 LAB — LIPID PANEL
Cholesterol: 187 mg/dL (ref 0–200)
LDL Cholesterol: 105 mg/dL — ABNORMAL HIGH (ref 0–99)
Triglycerides: 152 mg/dL — ABNORMAL HIGH (ref 0.0–149.0)

## 2012-07-20 LAB — VITAMIN D 25 HYDROXY (VIT D DEFICIENCY, FRACTURES): Vit D, 25-Hydroxy: 39 ng/mL (ref 30–89)

## 2012-07-28 ENCOUNTER — Encounter: Payer: Self-pay | Admitting: Family Medicine

## 2012-07-28 ENCOUNTER — Ambulatory Visit (INDEPENDENT_AMBULATORY_CARE_PROVIDER_SITE_OTHER): Payer: Medicare Other | Admitting: Family Medicine

## 2012-07-28 ENCOUNTER — Other Ambulatory Visit (HOSPITAL_COMMUNITY)
Admission: RE | Admit: 2012-07-28 | Discharge: 2012-07-28 | Disposition: A | Discharge status: Home / Self Care | Payer: Medicare Other | Source: Ambulatory Visit | Attending: Family Medicine | Admitting: Family Medicine

## 2012-07-28 VITALS — BP 142/82 | HR 72 | Temp 98.0°F | Ht 65.0 in

## 2012-07-28 DIAGNOSIS — Z0000 Encounter for general adult medical examination without abnormal findings: Secondary | ICD-10-CM

## 2012-07-28 DIAGNOSIS — Z1231 Encounter for screening mammogram for malignant neoplasm of breast: Secondary | ICD-10-CM

## 2012-07-28 DIAGNOSIS — Z01419 Encounter for gynecological examination (general) (routine) without abnormal findings: Secondary | ICD-10-CM | POA: Insufficient documentation

## 2012-07-28 DIAGNOSIS — F411 Generalized anxiety disorder: Secondary | ICD-10-CM

## 2012-07-28 DIAGNOSIS — Z23 Encounter for immunization: Secondary | ICD-10-CM

## 2012-07-28 DIAGNOSIS — Z1151 Encounter for screening for human papillomavirus (HPV): Secondary | ICD-10-CM | POA: Insufficient documentation

## 2012-07-28 DIAGNOSIS — M949 Disorder of cartilage, unspecified: Secondary | ICD-10-CM

## 2012-07-28 DIAGNOSIS — E538 Deficiency of other specified B group vitamins: Secondary | ICD-10-CM

## 2012-07-28 DIAGNOSIS — E785 Hyperlipidemia, unspecified: Secondary | ICD-10-CM

## 2012-07-28 DIAGNOSIS — I10 Essential (primary) hypertension: Secondary | ICD-10-CM

## 2012-07-28 DIAGNOSIS — Z136 Encounter for screening for cardiovascular disorders: Secondary | ICD-10-CM

## 2012-07-28 DIAGNOSIS — M899 Disorder of bone, unspecified: Secondary | ICD-10-CM

## 2012-07-28 NOTE — Assessment & Plan Note (Signed)
Well controlled 

## 2012-07-28 NOTE — Patient Instructions (Addendum)
Get back on track with diet and exercise.  Call to schedule mammogram and bone density. (Pt will schedule herself)  Look into living will and HCPOA.

## 2012-07-28 NOTE — Assessment & Plan Note (Signed)
Well controlled. Continue current medication. Follow at home.

## 2012-07-28 NOTE — Assessment & Plan Note (Signed)
Vit D nml, due for DEXA.

## 2012-07-28 NOTE — Progress Notes (Signed)
Subjective:    Patient ID: Tammy Schmidt, female    DOB: [birth year], 66 y.o.   MRN: [id number]  HPI WELCOME TO MEDICARE I have personally reviewed the Medicare Annual Wellness questionnaire and have noted 1. The patient's medical and social history 2. Their use of alcohol, tobacco or illicit drugs 3. Their current medications and supplements 4. The patient's functional ability including ADL's, fall risks, home safety risks and hearing or visual             impairment. 5. Diet and physical activities 6. Evidence for depression or mood disorders The patients weight, height, BMI and visual acuity have been recorded in the chart I have made referrals, counseling and provided education to the patient based review of the above and I have provided the pt with a written personalized care plan for preventive services.   The patient is here for annual wellness exam and preventative care.   Hypertension: Borderline control today (has some white coat), well controlled at home on lisinopril HCTZ.  Using medication without problems or lightheadedness: None Chest pain with exertion: None  Edema:None  Short of breath:None  Average home BPs: checking occ 106-130/70-80 Other issues:   Anxiety:well controlled at this time on zoloft 50 mg . She feels like when she was on sertraline she was more steady with reactiond and stress.  No longer seeing [facility].   Elevated Cholesterol:  Well controlled on simvastain 40 mg daily  Lab Results  Component Value Date   CHOL 187 07/19/2012   HDL 51.50 07/19/2012   LDLCALC 105* 07/19/2012   TRIG 152.0* 07/19/2012   CHOLHDL 4 07/19/2012  Using medications without problems:None  Muscle aches: None  Exercise: less this year than last, walking   Diet: moderate Other complaints: LFTs are normal.  Osteopenia: nml vit d       Review of Systems  Constitutional: Negative for fever, fatigue and unexpected weight change.  HENT: Negative for ear pain,  congestion, sore throat, sneezing, trouble swallowing and sinus pressure.   Eyes: Negative for pain and itching.  Respiratory: Negative for cough, shortness of breath and wheezing.   Cardiovascular: Negative for chest pain, palpitations and leg swelling.  Gastrointestinal: Negative for nausea, abdominal pain, diarrhea, constipation and blood in stool.  Genitourinary: Negative for dysuria, hematuria, vaginal discharge, difficulty urinating and menstrual problem.  Skin: Negative for rash.  Neurological: Negative for syncope, weakness, light-headedness, numbness and headaches.  Psychiatric/Behavioral: Negative for confusion and dysphoric mood. The patient is not nervous/anxious.        Objective:   Physical Exam  Constitutional: Vital signs are normal. She appears well-developed and well-nourished. She is cooperative.  Non-toxic appearance. She does not appear ill. No distress.  HENT:  Head: Normocephalic.  Right Ear: Hearing, tympanic membrane, external ear and ear canal normal.  Left Ear: Hearing, tympanic membrane, external ear and ear canal normal.  Nose: Nose normal.  Eyes: Conjunctivae normal, EOM and lids are normal. Pupils are equal, round, and reactive to light. No foreign bodies found.  Neck: Trachea normal and normal range of motion. Neck supple. Carotid bruit is not present. No mass and no thyromegaly present.  Cardiovascular: Normal rate, regular rhythm, S1 normal, S2 normal, normal heart sounds and intact distal pulses.  Exam reveals no gallop.   No murmur heard. Pulmonary/Chest: Effort normal and breath sounds normal. No respiratory distress. She has no wheezes. She has no rhonchi. She has no rales.  Abdominal: Soft. Normal appearance and bowel sounds are normal.  She exhibits no distension, no fluid wave, no abdominal bruit and no mass. There is no hepatosplenomegaly. There is no tenderness. There is no rebound, no guarding and no CVA tenderness. No hernia.  Genitourinary:  Vagina normal and uterus normal. No breast swelling, tenderness, discharge or bleeding. Pelvic exam was performed with patient prone. There is no rash, tenderness or lesion on the right labia. There is no rash, tenderness or lesion on the left labia. Uterus is not enlarged and not tender. Cervix exhibits no motion tenderness, no discharge and no friability. Right adnexum displays no mass, no tenderness and no fullness. Left adnexum displays no mass, no tenderness and no fullness.  Lymphadenopathy:    She has no cervical adenopathy.    She has no axillary adenopathy.  Neurological: She is alert. She has normal strength. No cranial nerve deficit or sensory deficit.  Skin: Skin is warm, dry and intact. No rash noted.  Psychiatric: Her speech is normal and behavior is normal. Judgment normal. Her mood appears not anxious. Cognition and memory are normal. She does not exhibit a depressed mood.          Assessment & Plan:   CPX: The patient's preventative maintenance and recommended screening tests for an annual wellness exam were reviewed in full today.  Brought up to date unless services declined.  Counselled on the importance of diet, exercise, and its role in overall health and mortality.  The patient's FH and SH was reviewed, including their home life, tobacco status, and drug and alcohol status.   Mammo: due will schedule  DXA: Last DXA 2011 no change in osteopenia in spine and hip. DVE, PAP: DVE and pap due this year 2013.Marland Kitchen After this year no more pap. Colon: polyp 2011.Marland Kitchen Repeat 10 years  Vaccines: Not interested in shingles vaccine.  Got flu vaccine.

## 2012-07-28 NOTE — Assessment & Plan Note (Signed)
Well controlled. Continue current medication. Encouraged exercise, weight loss, healthy eating habits.  

## 2012-08-03 ENCOUNTER — Encounter: Payer: Self-pay | Admitting: *Deleted

## 2012-08-15 ENCOUNTER — Other Ambulatory Visit: Payer: Self-pay | Admitting: Family Medicine

## 2012-08-15 MED ORDER — SERTRALINE HCL 50 MG PO TABS: 50.0000 mg | ORAL_TABLET | Freq: Every day | ORAL | Status: DC

## 2012-08-28 ENCOUNTER — Other Ambulatory Visit: Payer: Self-pay | Admitting: Family Medicine

## 2012-10-11 ENCOUNTER — Encounter: Payer: Self-pay | Admitting: Family Medicine

## 2012-10-16 ENCOUNTER — Encounter: Payer: Self-pay | Admitting: Family Medicine

## 2012-10-17 ENCOUNTER — Encounter: Payer: Self-pay | Admitting: *Deleted

## 2012-10-17 ENCOUNTER — Encounter: Payer: Self-pay | Admitting: Family Medicine

## 2012-11-08 ENCOUNTER — Other Ambulatory Visit: Payer: Self-pay | Admitting: Family Medicine

## 2013-04-25 ENCOUNTER — Other Ambulatory Visit: Payer: Self-pay

## 2013-07-24 ENCOUNTER — Telehealth: Payer: Self-pay | Admitting: Family Medicine

## 2013-07-24 ENCOUNTER — Other Ambulatory Visit (INDEPENDENT_AMBULATORY_CARE_PROVIDER_SITE_OTHER): Payer: Medicare Other

## 2013-07-24 DIAGNOSIS — E538 Deficiency of other specified B group vitamins: Secondary | ICD-10-CM

## 2013-07-24 DIAGNOSIS — E785 Hyperlipidemia, unspecified: Secondary | ICD-10-CM

## 2013-07-24 DIAGNOSIS — M899 Disorder of bone, unspecified: Secondary | ICD-10-CM

## 2013-07-24 LAB — LIPID PANEL
Cholesterol: 216 mg/dL — ABNORMAL HIGH (ref 0–200)
HDL: 54.4 mg/dL (ref >39.00)
Total CHOL/HDL Ratio: 4
Triglycerides: 200 mg/dL — ABNORMAL HIGH (ref 0.0–149.0)
VLDL: 40 mg/dL (ref 0.0–40.0)

## 2013-07-24 LAB — COMPREHENSIVE METABOLIC PANEL
ALT: 23 U/L (ref 0–35)
Alkaline Phosphatase: 23 U/L — ABNORMAL LOW (ref 39–117)
CO2: 33 mEq/L — ABNORMAL HIGH (ref 19–32)
Creatinine, Ser: 0.9 mg/dL (ref 0.4–1.2)
GFR: 66.57 mL/min (ref >60.00)
Total Bilirubin: 0.6 mg/dL (ref 0.3–1.2)

## 2013-07-24 NOTE — Telephone Encounter (Signed)
Message copied by Excell Seltzer on Tue Jul 24, 2013  7:45 AM ------      Message from: Alvina Chou      Created: Fri Jul 20, 2013  5:06 PM      Regarding: Lab orders for Tuesday, 11.4.14       Patient is scheduled for CPX labs, please order future labs, Thanks , Terri       ------

## 2013-07-25 LAB — VITAMIN D 25 HYDROXY (VIT D DEFICIENCY, FRACTURES): Vit D, 25-Hydroxy: 35 ng/mL (ref 30–89)

## 2013-07-26 ENCOUNTER — Other Ambulatory Visit: Payer: Self-pay

## 2013-07-31 ENCOUNTER — Ambulatory Visit (INDEPENDENT_AMBULATORY_CARE_PROVIDER_SITE_OTHER): Payer: Medicare Other | Admitting: Family Medicine

## 2013-07-31 ENCOUNTER — Encounter: Payer: Self-pay | Admitting: Family Medicine

## 2013-07-31 VITALS — BP 150/80 | HR 72 | Temp 98.1°F | Ht 64.0 in | Wt 162.0 lb

## 2013-07-31 DIAGNOSIS — Z23 Encounter for immunization: Secondary | ICD-10-CM

## 2013-07-31 DIAGNOSIS — Z0000 Encounter for general adult medical examination without abnormal findings: Secondary | ICD-10-CM

## 2013-07-31 DIAGNOSIS — E785 Hyperlipidemia, unspecified: Secondary | ICD-10-CM

## 2013-07-31 DIAGNOSIS — F411 Generalized anxiety disorder: Secondary | ICD-10-CM

## 2013-07-31 DIAGNOSIS — I10 Essential (primary) hypertension: Secondary | ICD-10-CM

## 2013-07-31 DIAGNOSIS — M899 Disorder of bone, unspecified: Secondary | ICD-10-CM

## 2013-07-31 DIAGNOSIS — E538 Deficiency of other specified B group vitamins: Secondary | ICD-10-CM

## 2013-07-31 MED ORDER — LISINOPRIL-HYDROCHLOROTHIAZIDE 20-25 MG PO TABS: 1.0000 | ORAL_TABLET | Freq: Every day | ORAL | Status: DC

## 2013-07-31 NOTE — Patient Instructions (Addendum)
Change BP med to 20/25 mg daily... Follow  BP at home.. Call if greater than 140/90 consistently. Get back to deit and exercsie routine. Schedule mammogram. Set up shingles vaccine at pharmacy.

## 2013-07-31 NOTE — Assessment & Plan Note (Signed)
Well controlled on lwer dose BP med... cahnge to 1 tab only but increase to 20/25 mg daily.

## 2013-07-31 NOTE — Progress Notes (Signed)
Pre-visit discussion using our clinic review tool. No additional management support is needed unless otherwise documented below in the visit note.  

## 2013-07-31 NOTE — Assessment & Plan Note (Addendum)
Worsened control with recent poor lifestyle.

## 2013-07-31 NOTE — Progress Notes (Signed)
I have personally reviewed the Medicare Annual Wellness questionnaire and have noted  1. The patient's medical and social history  2. Their use of alcohol, tobacco or illicit drugs  3. Their current medications and supplements  4. The patient's functional ability including ADL's, fall risks, home safety risks and hearing or visual  impairment.  5. Diet and physical activities  6. Evidence for depression or mood disorders  The patients weight, height, BMI and visual acuity have been recorded in the chart  I have made referrals, counseling and provided education to the patient based review of the above and I have provided the pt with a written personalized care plan for preventive services.  The patient is here for annual wellness exam and preventative care.   Husband with CVA 4 months ago..doing well though, progressing.  Hypertension: Borderline control today (has some white coat), well controlled at home on lisinopril HCTZ.  BP Readings from Last 3 Encounters:  07/31/13 150/80  07/28/12 142/82  06/10/11 130/80  Using medication without problems or lightheadedness: None  Chest pain with exertion: None  Edema:None  Short of breath:None  Average home BPs: checking occ 119-127/60-69 on one tab BP med only.Marland Kitchenoccasionally 140/90 Other issues:   Anxiety:well controlled at this time on zoloft 50 mg .  She feels like when she was on sertraline she was more steady with reactiond and stress.  No longer seeing [facility].   Elevated Cholesterol:  Worsened control on simvastain 40 mg daily but recent lifestyle change. Lab Results  Component Value Date   CHOL 216* 07/24/2013   HDL 54.40 07/24/2013   LDLCALC 105* 07/19/2012   LDLDIRECT 129.5 07/24/2013   TRIG 200.0* 07/24/2013   CHOLHDL 4 07/24/2013   Using medications without problems:None  Muscle aches: None  Exercise: none recently.. Will restart. Diet: moderate  Other complaints: LFTs are normal.   Osteopenia: nml vit d    Review of  Systems  Constitutional: Negative for fever, fatigue and unexpected weight change.  HENT: Negative for ear pain, congestion, sore throat, sneezing, trouble swallowing and sinus pressure.  Eyes: Negative for pain and itching.  Respiratory: Negative for cough, shortness of breath and wheezing.  Cardiovascular: Negative for chest pain, palpitations and leg swelling.  Gastrointestinal: Negative for nausea, abdominal pain, diarrhea, constipation and blood in stool.  Genitourinary: Negative for dysuria, hematuria, vaginal discharge, difficulty urinating and menstrual problem.  Skin: Negative for rash.  Neurological: Negative for syncope, weakness, light-headedness, numbness and headaches.  Psychiatric/Behavioral: Negative for confusion and dysphoric mood. The patient is not nervous/anxious.  Objective:   Physical Exam  Constitutional: Vital signs are normal. She appears well-developed and well-nourished. She is cooperative. Non-toxic appearance. She does not appear ill. No distress.  HENT:  Head: Normocephalic.  Right Ear: Hearing, tympanic membrane, external ear and ear canal normal.  Left Ear: Hearing, tympanic membrane, external ear and ear canal normal.  Nose: Nose normal.  Eyes: Conjunctivae normal, EOM and lids are normal. Pupils are equal, round, and reactive to light. No foreign bodies found.  Neck: Trachea normal and normal range of motion. Neck supple. Carotid bruit is not present. No mass and no thyromegaly present.  Cardiovascular: Normal rate, regular rhythm, S1 normal, S2 normal, normal heart sounds and intact distal pulses. Exam reveals no gallop.  No murmur heard.  Pulmonary/Chest: Effort normal and breath sounds normal. No respiratory distress. She has no wheezes. She has no rhonchi. She has no rales.  Abdominal: Soft. Normal appearance and bowel sounds are normal.  She exhibits no distension, no fluid wave, no abdominal bruit and no mass. There is no hepatosplenomegaly. There is no  tenderness. There is no rebound, no guarding and no CVA tenderness. No hernia.  Genitourinary: Vagina normal and uterus normal. No breast swelling, tenderness, discharge or bleeding. Pelvic exam was performed with patient prone. There is no rash, tenderness or lesion on the right labia. There is no rash, tenderness or lesion on the left labia. Uterus is not enlarged and not tender.  Right adnexum displays no mass, no tenderness and no fullness. Left adnexum displays no mass, no tenderness and no fullness.  NO PAP PERFORMED. Lymphadenopathy:  She has no cervical adenopathy.  She has no axillary adenopathy.  Neurological: She is alert. She has normal strength. No cranial nerve deficit or sensory deficit.  Skin: Skin is warm, dry and intact. No rash noted.  Psychiatric: Her speech is normal and behavior is normal. Judgment normal. Her mood appears not anxious. Cognition and memory are normal. She does not exhibit a depressed mood.  Assessment & Plan:    CPX: The patient's preventative maintenance and recommended screening tests for an annual wellness exam were reviewed in full today.  Brought up to date unless services declined.  Counselled on the importance of diet, exercise, and its role in overall health and mortality.  The patient's FH and SH was reviewed, including their home life, tobacco status, and drug and alcohol status.   Mammo: due will schedule  DEXA: 09/2012 Mild osteopenia, spine density somewhat improved since last check! Continue weight bearing exercise and Ca and vit D.     DVE, PAP: DVE yearly, no pap indicated Colon: polyp 2011.Marland Kitchen Repeat 10 years  Vaccines: Not interested in shingles vaccine here.. Get in pharmacy. Got flu vaccine.

## 2013-07-31 NOTE — Assessment & Plan Note (Signed)
Stable

## 2013-07-31 NOTE — Assessment & Plan Note (Signed)
Well controlled on sertraline.

## 2013-09-21 ENCOUNTER — Other Ambulatory Visit: Payer: Self-pay | Admitting: Family Medicine

## 2013-10-15 ENCOUNTER — Encounter: Payer: Self-pay | Admitting: Family Medicine

## 2013-11-07 ENCOUNTER — Other Ambulatory Visit: Payer: Self-pay | Admitting: Family Medicine

## 2014-03-21 ENCOUNTER — Other Ambulatory Visit: Payer: Self-pay | Admitting: Family Medicine

## 2014-05-28 ENCOUNTER — Encounter: Payer: Self-pay | Admitting: Family Medicine

## 2014-05-30 MED ORDER — SERTRALINE HCL 100 MG PO TABS: ORAL_TABLET | ORAL | Status: DC

## 2014-07-12 LAB — HM DIABETES EYE EXAM

## 2014-07-23 ENCOUNTER — Encounter: Payer: Self-pay | Admitting: Family Medicine

## 2014-08-05 ENCOUNTER — Telehealth: Payer: Self-pay | Admitting: Family Medicine

## 2014-08-05 ENCOUNTER — Other Ambulatory Visit (INDEPENDENT_AMBULATORY_CARE_PROVIDER_SITE_OTHER): Payer: Medicare HMO

## 2014-08-05 DIAGNOSIS — M8580 Other specified disorders of bone density and structure, unspecified site: Secondary | ICD-10-CM

## 2014-08-05 DIAGNOSIS — E785 Hyperlipidemia, unspecified: Secondary | ICD-10-CM

## 2014-08-05 DIAGNOSIS — E538 Deficiency of other specified B group vitamins: Secondary | ICD-10-CM

## 2014-08-05 LAB — COMPREHENSIVE METABOLIC PANEL
ALBUMIN: 4.4 g/dL (ref 3.5–5.2)
ALT: 30 U/L (ref 0–35)
AST: 30 U/L (ref 0–37)
Alkaline Phosphatase: 22 U/L — ABNORMAL LOW (ref 39–117)
BUN: 14 mg/dL (ref 6–23)
CHLORIDE: 102 meq/L (ref 96–112)
CO2: 29 mEq/L (ref 19–32)
Calcium: 9.6 mg/dL (ref 8.4–10.5)
Creatinine, Ser: 0.9 mg/dL (ref 0.4–1.2)
GFR: 64.7 mL/min (ref >60.00)
Glucose, Bld: 99 mg/dL (ref 70–99)
POTASSIUM: 3.8 meq/L (ref 3.5–5.1)
Sodium: 142 mEq/L (ref 135–145)
TOTAL PROTEIN: 7.6 g/dL (ref 6.0–8.3)
Total Bilirubin: 1 mg/dL (ref 0.2–1.2)

## 2014-08-05 LAB — LIPID PANEL
CHOL/HDL RATIO: 4
Cholesterol: 216 mg/dL — ABNORMAL HIGH (ref 0–200)
HDL: 50 mg/dL (ref >39.00)
LDL Cholesterol: 136 mg/dL — ABNORMAL HIGH (ref 0–99)
NONHDL: 166
Triglycerides: 152 mg/dL — ABNORMAL HIGH (ref 0.0–149.0)
VLDL: 30.4 mg/dL (ref 0.0–40.0)

## 2014-08-05 LAB — VITAMIN B12: VITAMIN B 12: 420 pg/mL (ref 211–911)

## 2014-08-05 LAB — VITAMIN D 25 HYDROXY (VIT D DEFICIENCY, FRACTURES): VITD: 27.68 ng/mL — ABNORMAL LOW (ref 30.00–100.00)

## 2014-08-05 NOTE — Telephone Encounter (Signed)
-----   Message from Ellamae Sia sent at 08/01/2014  3:06 PM EST ----- Regarding: Lab orders for Monday,11.16.15 Patient is scheduled for CPX labs, please order future labs, Thanks , Karna Christmas

## 2014-08-09 ENCOUNTER — Encounter: Payer: Self-pay | Admitting: Family Medicine

## 2014-08-09 ENCOUNTER — Ambulatory Visit (INDEPENDENT_AMBULATORY_CARE_PROVIDER_SITE_OTHER): Payer: Medicare HMO | Admitting: Family Medicine

## 2014-08-09 VITALS — BP 137/77 | HR 72 | Temp 98.2°F | Ht 64.0 in | Wt 161.0 lb

## 2014-08-09 DIAGNOSIS — Z23 Encounter for immunization: Secondary | ICD-10-CM

## 2014-08-09 DIAGNOSIS — Z0000 Encounter for general adult medical examination without abnormal findings: Secondary | ICD-10-CM

## 2014-08-09 DIAGNOSIS — Z7189 Other specified counseling: Secondary | ICD-10-CM | POA: Insufficient documentation

## 2014-08-09 DIAGNOSIS — E785 Hyperlipidemia, unspecified: Secondary | ICD-10-CM

## 2014-08-09 DIAGNOSIS — I10 Essential (primary) hypertension: Secondary | ICD-10-CM

## 2014-08-09 DIAGNOSIS — E538 Deficiency of other specified B group vitamins: Secondary | ICD-10-CM

## 2014-08-09 NOTE — Progress Notes (Signed)
have personally reviewed the Medicare Annual Wellness questionnaire and have noted  1. The patient's medical and social history  2. Their use of alcohol, tobacco or illicit drugs  3. Their current medications and supplements  4. The patient's functional ability including ADL's, fall risks, home safety risks and hearing or visual  impairment.  5. Diet and physical activities  6. Evidence for depression or mood disorders  The patients weight, height, BMI and visual acuity have been recorded in the chart  I have made referrals, counseling and provided education to the patient based review of the above and I have provided the pt with a written personalized care plan for preventive services.  The patient is here for annual wellness exam and preventative care.   Husband with CVA 2 years ago. Doing better. She is doing well overall.  Hypertension: Well controlled on 1 tab daily lisinopril HCTZ.  BP Readings from Last 3 Encounters:  08/09/14 137/77  07/31/13 150/80  07/28/12 142/82  Using medication without problems or lightheadedness: None  Chest pain with exertion: None  Edema:None  Short of breath:None  Average home BPs: 120/70 Other issues:   Anxiety:well controlled at this time on zoloft 100 mg . Doing better on higher dose. No SE. No longer seeing [facility].   Elevated Cholesterol:  Worsened control on simvastain 40 mg daily . Lab Results  Component Value Date   CHOL 216* 08/05/2014   HDL 50.00 08/05/2014   LDLCALC 136* 08/05/2014   LDLDIRECT 129.5 07/24/2013   TRIG 152.0* 08/05/2014   CHOLHDL 4 08/05/2014  Using medications without problems:None  Muscle aches: None  Exercise: none recently.. Will restart. Diet: eating out more Other complaints: LFTs are normal.   She  reports that she has quit smoking. She has never used smokeless tobacco. She reports that she drinks alcohol. She reports that she does not use illicit drugs.  Review of Systems   Constitutional: Negative for fever, fatigue and unexpected weight change.  HENT: Negative for ear pain, congestion, sore throat, sneezing, trouble swallowing and sinus pressure.  Eyes: Negative for pain and itching.  Respiratory: Negative for cough, shortness of breath and wheezing.  Cardiovascular: Negative for chest pain, palpitations and leg swelling.  Gastrointestinal: Negative for nausea, abdominal pain, diarrhea, constipation and blood in stool.  Genitourinary: Negative for dysuria, hematuria, vaginal discharge, difficulty urinating and menstrual problem.  Skin: Negative for rash.  Neurological: Negative for syncope, weakness, light-headedness, numbness and headaches.  Psychiatric/Behavioral: Negative for confusion and dysphoric mood. The patient is not nervous/anxious.  Objective:   Physical Exam  Constitutional: Vital signs are normal. She appears well-developed and well-nourished. She is cooperative. Non-toxic appearance. She does not appear ill. No distress.  HENT:  Head: Normocephalic.  Right Ear: Hearing, tympanic membrane, external ear and ear canal normal.  Left Ear: Hearing, tympanic membrane, external ear and ear canal normal.  Nose: Nose normal.  Eyes: Conjunctivae normal, EOM and lids are normal. Pupils are equal, round, and reactive to light. No foreign bodies found.  Neck: Trachea normal and normal range of motion. Neck supple. Carotid bruit is not present. No mass and no thyromegaly present.  Cardiovascular: Normal rate, regular rhythm, S1 normal, S2 normal, normal heart sounds and intact distal pulses. Exam reveals no gallop.  No murmur heard.  Pulmonary/Chest: Effort normal and breath sounds normal. No respiratory distress. She has no wheezes. She has no rhonchi. She has no rales.  Abdominal: Soft. Normal appearance and bowel sounds are normal. She exhibits no  distension, no fluid wave, no abdominal bruit and no mass. There is no  hepatosplenomegaly. There is no tenderness. There is no rebound, no guarding and no CVA tenderness. No hernia.  Genitourinary: Vagina normal and uterus normal. No breast swelling, tenderness, discharge or bleeding. Pelvic exam was performed with patient prone. There is no rash, tenderness or lesion on the right labia. There is no rash, tenderness or lesion on the left labia. Uterus is not enlarged and not tender. Right adnexum displays no mass, no tenderness and no fullness. Left adnexum displays no mass, no tenderness and no fullness. NO PAP PERFORMED. Lymphadenopathy:  She has no cervical adenopathy.  She has no axillary adenopathy.  Neurological: She is alert. She has normal strength. No cranial nerve deficit or sensory deficit.  Skin: Skin is warm, dry and intact. No rash noted.  Psychiatric: Her speech is normal and behavior is normal. Judgment normal. Her mood appears not anxious. Cognition and memory are normal. She does not exhibit a depressed mood.  Assessment & Plan:    CPX: The patient's preventative maintenance and recommended screening tests for an annual wellness exam were reviewed in full today.  Brought up to date unless services declined.  Counselled on the importance of diet, exercise, and its role in overall health and mortality.  The patient's FH and SH was reviewed, including their home life, tobacco status, and drug and alcohol status.   Mammo: 09/2013 nml DEXA: 09/2012 Mild osteopenia, spine density somewhat improved since last check! Continue weight bearing exercise and Ca and vit D. Recheck in 5 years 2019.     DVE, PAP: DVE yearly, no pap indicated, no family history Colon: polyp 2011.Marland Kitchen Repeat 10 years  Vaccines: Given prevnar, already had flu.

## 2014-08-09 NOTE — Assessment & Plan Note (Signed)
Worsened control despite simvastatin. Will get back on track with lifestyle changes.

## 2014-08-09 NOTE — Assessment & Plan Note (Signed)
At goal.  

## 2014-08-09 NOTE — Patient Instructions (Addendum)
Get back on track with lifestyle change healthy eating and exercsie to lower chol. Recheck cholesterol in 1 year.  Schedule mammogram on own in 09/2014.

## 2014-08-09 NOTE — Progress Notes (Signed)
Pre visit review using our clinic review tool, if applicable. No additional management support is needed unless otherwise documented below in the visit note. 

## 2014-08-09 NOTE — Assessment & Plan Note (Signed)
Well controlled. Continue current medication.  

## 2014-08-09 NOTE — Addendum Note (Signed)
Addended by: Carter Kitten on: 08/09/2014 11:47 AM   Modules accepted: Orders

## 2014-08-12 ENCOUNTER — Telehealth: Payer: Self-pay | Admitting: Family Medicine

## 2014-08-12 NOTE — Telephone Encounter (Signed)
emmi emailed °

## 2014-09-23 ENCOUNTER — Other Ambulatory Visit: Payer: Self-pay | Admitting: Family Medicine

## 2014-11-11 ENCOUNTER — Encounter: Payer: Self-pay | Admitting: Family Medicine

## 2014-12-30 ENCOUNTER — Other Ambulatory Visit: Payer: Self-pay | Admitting: Family Medicine

## 2015-03-25 ENCOUNTER — Other Ambulatory Visit: Payer: Self-pay | Admitting: Family Medicine

## 2015-06-20 ENCOUNTER — Other Ambulatory Visit: Payer: Self-pay | Admitting: Family Medicine

## 2015-07-03 ENCOUNTER — Ambulatory Visit (INDEPENDENT_AMBULATORY_CARE_PROVIDER_SITE_OTHER): Payer: Medicare HMO

## 2015-07-03 DIAGNOSIS — Z23 Encounter for immunization: Secondary | ICD-10-CM

## 2015-07-30 DIAGNOSIS — H2513 Age-related nuclear cataract, bilateral: Secondary | ICD-10-CM | POA: Diagnosis not present

## 2015-07-30 DIAGNOSIS — H524 Presbyopia: Secondary | ICD-10-CM | POA: Diagnosis not present

## 2015-07-30 DIAGNOSIS — H35033 Hypertensive retinopathy, bilateral: Secondary | ICD-10-CM | POA: Diagnosis not present

## 2015-07-30 LAB — HM DIABETES EYE EXAM

## 2015-08-19 ENCOUNTER — Encounter: Payer: Self-pay | Admitting: Family Medicine

## 2015-09-16 ENCOUNTER — Telehealth: Payer: Self-pay | Admitting: Family Medicine

## 2015-09-16 DIAGNOSIS — E538 Deficiency of other specified B group vitamins: Secondary | ICD-10-CM

## 2015-09-16 DIAGNOSIS — E785 Hyperlipidemia, unspecified: Secondary | ICD-10-CM

## 2015-09-16 DIAGNOSIS — M8580 Other specified disorders of bone density and structure, unspecified site: Secondary | ICD-10-CM

## 2015-09-16 DIAGNOSIS — Z1159 Encounter for screening for other viral diseases: Secondary | ICD-10-CM

## 2015-09-16 NOTE — Telephone Encounter (Signed)
-----   Message from Ellamae Sia sent at 09/04/2015  3:31 PM EST ----- Regarding: Lab orders for Friday, 12.30.16 Patient is scheduled for CPX labs, please order future labs, Thanks , Karna Christmas

## 2015-09-19 ENCOUNTER — Other Ambulatory Visit: Payer: Self-pay | Admitting: Family Medicine

## 2015-09-19 ENCOUNTER — Other Ambulatory Visit (INDEPENDENT_AMBULATORY_CARE_PROVIDER_SITE_OTHER): Payer: Medicare HMO

## 2015-09-19 DIAGNOSIS — M8580 Other specified disorders of bone density and structure, unspecified site: Secondary | ICD-10-CM

## 2015-09-19 DIAGNOSIS — E785 Hyperlipidemia, unspecified: Secondary | ICD-10-CM

## 2015-09-19 DIAGNOSIS — E538 Deficiency of other specified B group vitamins: Secondary | ICD-10-CM | POA: Diagnosis not present

## 2015-09-19 DIAGNOSIS — E559 Vitamin D deficiency, unspecified: Secondary | ICD-10-CM | POA: Diagnosis not present

## 2015-09-19 DIAGNOSIS — Z1159 Encounter for screening for other viral diseases: Secondary | ICD-10-CM

## 2015-09-19 LAB — LIPID PANEL
CHOLESTEROL: 227 mg/dL — AB (ref 0–200)
HDL: 49.9 mg/dL (ref >39.00)
LDL CALC: 138 mg/dL — AB (ref 0–99)
NonHDL: 176.67
TRIGLYCERIDES: 192 mg/dL — AB (ref 0.0–149.0)
Total CHOL/HDL Ratio: 5
VLDL: 38.4 mg/dL (ref 0.0–40.0)

## 2015-09-19 LAB — COMPREHENSIVE METABOLIC PANEL
ALBUMIN: 4.2 g/dL (ref 3.5–5.2)
ALK PHOS: 24 U/L — AB (ref 39–117)
ALT: 21 U/L (ref 0–35)
AST: 21 U/L (ref 0–37)
BUN: 19 mg/dL (ref 6–23)
CALCIUM: 9.7 mg/dL (ref 8.4–10.5)
CHLORIDE: 100 meq/L (ref 96–112)
CO2: 35 mEq/L — ABNORMAL HIGH (ref 19–32)
Creatinine, Ser: 0.91 mg/dL (ref 0.40–1.20)
GFR: 65.3 mL/min (ref >60.00)
Glucose, Bld: 97 mg/dL (ref 70–99)
POTASSIUM: 3.4 meq/L — AB (ref 3.5–5.1)
SODIUM: 142 meq/L (ref 135–145)
TOTAL PROTEIN: 7.2 g/dL (ref 6.0–8.3)
Total Bilirubin: 0.8 mg/dL (ref 0.2–1.2)

## 2015-09-19 LAB — VITAMIN B12: Vitamin B-12: 373 pg/mL (ref 211–911)

## 2015-09-19 LAB — VITAMIN D 25 HYDROXY (VIT D DEFICIENCY, FRACTURES): VITD: 19.35 ng/mL — AB (ref 30.00–100.00)

## 2015-09-20 LAB — HEPATITIS C ANTIBODY: HCV AB: NEGATIVE

## 2015-09-26 ENCOUNTER — Encounter: Payer: Self-pay | Admitting: Family Medicine

## 2015-09-26 ENCOUNTER — Ambulatory Visit (INDEPENDENT_AMBULATORY_CARE_PROVIDER_SITE_OTHER): Payer: Medicare HMO | Admitting: Family Medicine

## 2015-09-26 VITALS — BP 128/80 | HR 76 | Temp 98.3°F | Ht 64.0 in | Wt 163.0 lb

## 2015-09-26 DIAGNOSIS — Z7189 Other specified counseling: Secondary | ICD-10-CM

## 2015-09-26 DIAGNOSIS — E785 Hyperlipidemia, unspecified: Secondary | ICD-10-CM

## 2015-09-26 DIAGNOSIS — E559 Vitamin D deficiency, unspecified: Secondary | ICD-10-CM

## 2015-09-26 DIAGNOSIS — Z0000 Encounter for general adult medical examination without abnormal findings: Secondary | ICD-10-CM | POA: Diagnosis not present

## 2015-09-26 DIAGNOSIS — I10 Essential (primary) hypertension: Secondary | ICD-10-CM

## 2015-09-26 DIAGNOSIS — F411 Generalized anxiety disorder: Secondary | ICD-10-CM

## 2015-09-26 DIAGNOSIS — Z23 Encounter for immunization: Secondary | ICD-10-CM

## 2015-09-26 DIAGNOSIS — E538 Deficiency of other specified B group vitamins: Secondary | ICD-10-CM

## 2015-09-26 MED ORDER — SIMVASTATIN 40 MG PO TABS: ORAL_TABLET | ORAL | Status: DC

## 2015-09-26 MED ORDER — LISINOPRIL-HYDROCHLOROTHIAZIDE 20-25 MG PO TABS: 1.0000 | ORAL_TABLET | Freq: Every day | ORAL | Status: DC

## 2015-09-26 MED ORDER — SERTRALINE HCL 100 MG PO TABS: 100.0000 mg | ORAL_TABLET | Freq: Every day | ORAL | Status: DC

## 2015-09-26 NOTE — Assessment & Plan Note (Signed)
Well controlled. Continue current medication.  

## 2015-09-26 NOTE — Assessment & Plan Note (Signed)
In nml range. 

## 2015-09-26 NOTE — Assessment & Plan Note (Signed)
Replete

## 2015-09-26 NOTE — Progress Notes (Signed)
I have personally reviewed the Medicare Annual Wellness questionnaire and have noted 1. The patient's medical and social history 2. Their use of alcohol, tobacco or illicit drugs 3. Their current medications and supplements 4. The patient's functional ability including ADL's, fall risks, home safety risks and hearing or visual             impairment. 5. Diet and physical activities 6. Evidence for depression or mood disorders 7.         Updated provider list Cognitive evaluation was performed and recorded on pt medicare questionnaire form. The patients weight, height, BMI and visual acuity have been recorded in the chart  I have made referrals, counseling and provided education to the patient based review of the above and I have provided the pt with a written personalized care plan for preventive services.   She is doing well overall.  Hypertension: Well controlled on 1 tab daily lisinopril HCTZ.  BP Readings from Last 3 Encounters:  09/26/15 128/80  08/09/14 137/77  07/31/13 150/80  Using medication without problems or lightheadedness: None  Chest pain with exertion: None  Edema:None  Short of breath:None  Average home BPs: 120/70 Other issues:   Anxiety:well controlled at this time on zoloft 100 mg . Doing better on higher dose. No SE. No longer seeing [facility].  Husband with CVA.  Elevated Cholesterol: Inadequate  control on simvastain 40 mg daily . Goal < 130. Lab Results  Component Value Date   CHOL 227* 09/19/2015   HDL 49.90 09/19/2015   LDLCALC 138* 09/19/2015   LDLDIRECT 129.5 07/24/2013   TRIG 192.0* 09/19/2015   CHOLHDL 5 09/19/2015  Using medications without problems:None  Muscle aches: None  Exercise: none recently.. Will restart. Diet: eating out more, poor control with diet. Other complaints: LFTs are normal.   Vit D low... Only occ taking vit. She reports that she has quit smoking. She has never used smokeless tobacco. She reports that she drinks  alcohol. She reports that she does not use illicit drugs. Social History /Family History/Past Medical History reviewed and updated if needed.  Review of Systems  Constitutional: Negative for fever, fatigue and unexpected weight change.  HENT: Negative for ear pain, congestion, sore throat, sneezing, trouble swallowing and sinus pressure.  Eyes: Negative for pain and itching.  Respiratory: Negative for cough, shortness of breath and wheezing.  Cardiovascular: Negative for chest pain, palpitations and leg swelling.  Gastrointestinal: Negative for nausea, abdominal pain, diarrhea, constipation and blood in stool.  Genitourinary: Negative for dysuria, hematuria, vaginal discharge, difficulty urinating and menstrual problem.  Skin: Negative for rash.  Neurological: Negative for syncope, weakness, light-headedness, numbness and headaches.  Psychiatric/Behavioral: Negative for confusion and dysphoric mood. The patient is not nervous/anxious.  Objective:   Physical Exam  Constitutional: Vital signs are normal. She appears well-developed and well-nourished. She is cooperative. Non-toxic appearance. She does not appear ill. No distress.  HENT:  Head: Normocephalic.  Right Ear: Hearing, tympanic membrane, external ear and ear canal normal.  Left Ear: Hearing, tympanic membrane, external ear and ear canal normal.  Nose: Nose normal.  Eyes: Conjunctivae normal, EOM and lids are normal. Pupils are equal, round, and reactive to light. No foreign bodies found.  Neck: Trachea normal and normal range of motion. Neck supple. Carotid bruit is not present. No mass and no thyromegaly present.  Cardiovascular: Normal rate, regular rhythm, S1 normal, S2 normal, normal heart sounds and intact distal pulses. Exam reveals no gallop.  No murmur heard.  Pulmonary/Chest: Effort normal and breath sounds normal. No respiratory distress. She has no wheezes. She has no rhonchi. She has no rales.   Abdominal: Soft. Normal appearance and bowel sounds are normal. She exhibits no distension, no fluid wave, no abdominal bruit and no mass. There is no hepatosplenomegaly. There is no tenderness. There is no rebound, no guarding and no CVA tenderness. No hernia.  Genitourinary: Vagina normal and uterus normal. No breast swelling, tenderness, discharge or bleeding. Pelvic exam was performed with patient prone. There is no rash, tenderness or lesion on the right labia. There is no rash, tenderness or lesion on the left labia. Uterus is not enlarged and not tender. Right adnexum displays no mass, no tenderness and no fullness. Left adnexum displays no mass, no tenderness and no fullness. NO PAP PERFORMED. Lymphadenopathy:  She has no cervical adenopathy.  She has no axillary adenopathy.  Neurological: She is alert. She has normal strength. No cranial nerve deficit or sensory deficit.  Skin: Skin is warm, dry and intact. No rash noted.  Psychiatric: Her speech is normal and behavior is normal. Judgment normal. Her mood appears not anxious. Cognition and memory are normal. She does not exhibit a depressed mood.  Assessment & Plan:    CPX: The patient's preventative maintenance and recommended screening tests for an annual wellness exam were reviewed in full today.  Brought up to date unless services declined.  Counselled on the importance of diet, exercise, and its role in overall health and mortality.  The patient's FH and SH was reviewed, including their home life, tobacco status, and drug and alcohol status.   Mammo: 09/2014 nml DEXA: 09/2012 Mild osteopenia, spine density somewhat improved since last check! Continue weight bearing exercise and Ca and vit D. Recheck in 5 years 2019.     DVE, PAP: DVE yearly, no pap indicated, no family history uterine or ovarian cancer, no vag symptoms. Colon: polyp 2011... Repeat 10 years  Vaccines: uptodate except  PCV 23, shingles and tdap.         nonsmoker

## 2015-09-26 NOTE — Assessment & Plan Note (Signed)
Continue medication, work on lifestyle changes. Encouraged exercise, weight loss, healthy eating habits.

## 2015-09-26 NOTE — Addendum Note (Signed)
Addended by: Despina Hidden on: 09/26/2015 12:54 PM   Modules accepted: Orders

## 2015-09-26 NOTE — Patient Instructions (Addendum)
Start vit D 3 supplement 400 IU twice daily.  Get back on track with low chol diet, start back regular exercise 3 times a week.  Schedule mammogram on your own.  Look into getting shingles, TDAP vaccine.

## 2015-09-26 NOTE — Progress Notes (Signed)
Pre visit review using our clinic review tool, if applicable. No additional management support is needed unless otherwise documented below in the visit note. 

## 2016-05-24 DIAGNOSIS — R69 Illness, unspecified: Secondary | ICD-10-CM | POA: Diagnosis not present

## 2016-06-18 ENCOUNTER — Ambulatory Visit (INDEPENDENT_AMBULATORY_CARE_PROVIDER_SITE_OTHER): Payer: Medicare HMO

## 2016-06-18 DIAGNOSIS — Z23 Encounter for immunization: Secondary | ICD-10-CM | POA: Diagnosis not present

## 2016-08-23 DIAGNOSIS — H2513 Age-related nuclear cataract, bilateral: Secondary | ICD-10-CM | POA: Diagnosis not present

## 2016-08-23 DIAGNOSIS — H524 Presbyopia: Secondary | ICD-10-CM | POA: Diagnosis not present

## 2016-08-23 DIAGNOSIS — H353111 Nonexudative age-related macular degeneration, right eye, early dry stage: Secondary | ICD-10-CM | POA: Diagnosis not present

## 2016-09-21 ENCOUNTER — Telehealth: Payer: Self-pay | Admitting: Family Medicine

## 2016-09-21 DIAGNOSIS — E538 Deficiency of other specified B group vitamins: Secondary | ICD-10-CM

## 2016-09-21 DIAGNOSIS — I10 Essential (primary) hypertension: Secondary | ICD-10-CM

## 2016-09-21 DIAGNOSIS — E559 Vitamin D deficiency, unspecified: Secondary | ICD-10-CM

## 2016-09-21 DIAGNOSIS — E782 Mixed hyperlipidemia: Secondary | ICD-10-CM

## 2016-09-21 NOTE — Telephone Encounter (Signed)
-----   Message from Ellamae Sia sent at 09/17/2016 10:33 AM EST ----- Regarding: Lab orders for Thursday, 1.11.18 Patient is scheduled for CPX labs, please order future labs, Thanks , Karna Christmas

## 2016-09-30 ENCOUNTER — Ambulatory Visit (INDEPENDENT_AMBULATORY_CARE_PROVIDER_SITE_OTHER): Payer: Medicare HMO

## 2016-09-30 ENCOUNTER — Other Ambulatory Visit: Payer: Medicare HMO

## 2016-09-30 VITALS — BP 126/84 | HR 75 | Temp 98.1°F | Ht 63.5 in | Wt 165.0 lb

## 2016-09-30 DIAGNOSIS — E782 Mixed hyperlipidemia: Secondary | ICD-10-CM

## 2016-09-30 DIAGNOSIS — E559 Vitamin D deficiency, unspecified: Secondary | ICD-10-CM

## 2016-09-30 DIAGNOSIS — Z0000 Encounter for general adult medical examination without abnormal findings: Secondary | ICD-10-CM | POA: Diagnosis not present

## 2016-09-30 DIAGNOSIS — Z130 Encounter for screening for diseases of the blood and blood-forming organs and certain disorders involving the immune mechanism: Secondary | ICD-10-CM | POA: Diagnosis not present

## 2016-09-30 DIAGNOSIS — E538 Deficiency of other specified B group vitamins: Secondary | ICD-10-CM | POA: Diagnosis not present

## 2016-09-30 LAB — CBC WITH DIFFERENTIAL/PLATELET
BASOS ABS: 0 10*3/uL (ref 0.0–0.1)
Basophils Relative: 0.5 % (ref 0.0–3.0)
Eosinophils Absolute: 0 10*3/uL (ref 0.0–0.7)
Eosinophils Relative: 0.4 % (ref 0.0–5.0)
HCT: 41.4 % (ref 36.0–46.0)
Hemoglobin: 13.8 g/dL (ref 12.0–15.0)
LYMPHS ABS: 1.6 10*3/uL (ref 0.7–4.0)
Lymphocytes Relative: 20.8 % (ref 12.0–46.0)
MCHC: 33.4 g/dL (ref 30.0–36.0)
MCV: 81.7 fl (ref 78.0–100.0)
MONO ABS: 0.5 10*3/uL (ref 0.1–1.0)
Monocytes Relative: 6.8 % (ref 3.0–12.0)
NEUTROS PCT: 71.5 % (ref 43.0–77.0)
Neutro Abs: 5.5 10*3/uL (ref 1.4–7.7)
Platelets: 252 10*3/uL (ref 150.0–400.0)
RBC: 5.07 Mil/uL (ref 3.87–5.11)
RDW: 14.3 % (ref 11.5–15.5)
WBC: 7.7 10*3/uL (ref 4.0–10.5)

## 2016-09-30 LAB — LIPID PANEL
CHOL/HDL RATIO: 4
CHOLESTEROL: 232 mg/dL — AB (ref 0–200)
HDL: 59.7 mg/dL (ref >39.00)
LDL CALC: 134 mg/dL — AB (ref 0–99)
NonHDL: 172.07
Triglycerides: 191 mg/dL — ABNORMAL HIGH (ref 0.0–149.0)
VLDL: 38.2 mg/dL (ref 0.0–40.0)

## 2016-09-30 LAB — COMPREHENSIVE METABOLIC PANEL
ALBUMIN: 4.6 g/dL (ref 3.5–5.2)
ALT: 31 U/L (ref 0–35)
AST: 26 U/L (ref 0–37)
Alkaline Phosphatase: 23 U/L — ABNORMAL LOW (ref 39–117)
BILIRUBIN TOTAL: 0.8 mg/dL (ref 0.2–1.2)
BUN: 15 mg/dL (ref 6–23)
CHLORIDE: 98 meq/L (ref 96–112)
CO2: 33 meq/L — AB (ref 19–32)
CREATININE: 0.85 mg/dL (ref 0.40–1.20)
Calcium: 10.1 mg/dL (ref 8.4–10.5)
GFR: 70.43 mL/min (ref >60.00)
Glucose, Bld: 92 mg/dL (ref 70–99)
Potassium: 3.3 mEq/L — ABNORMAL LOW (ref 3.5–5.1)
Sodium: 140 mEq/L (ref 135–145)
Total Protein: 7.6 g/dL (ref 6.0–8.3)

## 2016-09-30 LAB — VITAMIN D 25 HYDROXY (VIT D DEFICIENCY, FRACTURES): VITD: 31.37 ng/mL (ref 30.00–100.00)

## 2016-09-30 LAB — VITAMIN B12: VITAMIN B 12: 782 pg/mL (ref 211–911)

## 2016-09-30 NOTE — Progress Notes (Signed)
I reviewed health advisor's note, was available for consultation, and agree with documentation and plan.  Jerric Oyen, MD [facility] [place]  

## 2016-09-30 NOTE — Progress Notes (Signed)
PCP notes:   Health maintenance:  Shingles - pt needs new prescription Tetanus - pt needs new prescription  Abnormal screenings:   None  Patient concerns:   Pt stated she needs PCP to write new prescriptions for Tetanus and Shingles vaccines because she has misplaced them.   Nurse concerns:  None  Next PCP appt:   10/08/16 @ 1115

## 2016-09-30 NOTE — Progress Notes (Signed)
Subjective:   Tammy Schmidt is a 71 y.o. female who presents for Medicare Annual (Subsequent) preventive examination.  Review of Systems:  N/A Cardiac Risk Factors include: advanced age (>79men, >29 women);dyslipidemia;hypertension     Objective:     Vitals: BP 126/84 (BP Location: Right Arm, Patient Position: Sitting, Cuff Size: Normal)   Pulse 75   Temp 98.1 F (36.7 C) (Oral)   Ht 5' 3.5" (1.613 m) Comment: no shoes  Wt 165 lb (74.8 kg)   SpO2 98%   BMI 28.77 kg/m   Body mass index is 28.77 kg/m.   Tobacco History  Smoking Status  . Former Smoker  Smokeless Tobacco  . Never Used     Counseling given: No   Past Medical History:  Diagnosis Date  . Anxiety state, unspecified   . Disorder of bone and cartilage, unspecified   . Dyspepsia and other specified disorders of function of stomach   . Elevated blood pressure reading without diagnosis of hypertension   . Encounter for long-term (current) use of other medications   . Other and unspecified hyperlipidemia   . Other specified cardiac dysrhythmias(427.89)    Past Surgical History:  Procedure Laterality Date  . CARDIOVASCULAR STRESS TEST  11-2007   normal  . DOPPLER ECHOCARDIOGRAPHY  11-2007   normal  . LAPAROSCOPY FOR ECTOPIC PREGNANCY     removed fallopian tube  . TONSILLECTOMY     Family History  Problem Relation Age of Onset  . Heart attack Mother   . Heart attack Father   . Coronary artery disease Sister   . Hyperlipidemia Brother   . Hypertension Brother   . Hodgkin's lymphoma Sister   . Hypertension Sister   . Diabetes Sister   . Heart attack Sister    History  Sexual Activity  . Sexual activity: Not on file    Outpatient Encounter Prescriptions as of 09/30/2016  Medication Sig  . aspirin 81 MG tablet Take 81 mg by mouth daily.    . calcium-vitamin D (OSCAL WITH D) 500-200 MG-UNIT per tablet Take 1 tablet by mouth daily.    . Cyanocobalamin (VITAMIN B 12 PO) Take by mouth daily.  Marland Kitchen  lisinopril-hydrochlorothiazide (PRINZIDE,ZESTORETIC) 20-25 MG tablet Take 1 tablet by mouth daily.  . Multiple Vitamin (MULTIVITAMIN) tablet Take 1 tablet by mouth daily.  . sertraline (ZOLOFT) 100 MG tablet Take 1 tablet (100 mg total) by mouth daily.  . simvastatin (ZOCOR) 40 MG tablet TAKE ONE (1) TABLET BY MOUTH EVERY NIGHTAT BEDTIME   No facility-administered encounter medications on file as of 09/30/2016.     Activities of Daily Living In your present state of health, do you have any difficulty performing the following activities: 09/30/2016  Hearing? N  Vision? N  Difficulty concentrating or making decisions? N  Walking or climbing stairs? N  Dressing or bathing? N  Doing errands, shopping? N  Preparing Food and eating ? N  Using the Toilet? N  In the past six months, have you accidently leaked urine? Y  Do you have problems with loss of bowel control? N  Managing your Medications? N  Managing your Finances? N  Housekeeping or managing your Housekeeping? N  Some recent data might be hidden    Patient Care Team: [name], MD as PCP - General Thelma Comp, OD as Consulting Physician (Optometry)    Assessment:     Hearing Screening   125Hz  250Hz  500Hz  1000Hz  2000Hz  3000Hz  4000Hz  6000Hz  8000Hz   Right ear:  40 40 40  40    Left ear:   40 40 40  40    Vision Screening Comments: Last vision exam in 2017 with Dr. Maryruth Hancock B.    Exercise Activities and Dietary recommendations Current Exercise Habits: Home exercise routine, Type of exercise: Other - see comments (stationary bike), Time (Minutes): 30, Frequency (Times/Week): 3, Weekly Exercise (Minutes/Week): 90, Intensity: Moderate, Exercise limited by: None identified  Goals    . Increase physical activity          Starting 09/30/2016, I will continue to exercise at least 30 min 3 days per week.      Fall Risk Fall Risk  09/30/2016 08/09/2014 07/31/2013  Falls in the past year? No No No   Depression Screen PHQ  2/9 Scores 09/30/2016 08/09/2014 07/31/2013  PHQ - 2 Score 0 0 0     Cognitive Function MMSE - Mini Mental State Exam 09/30/2016  Orientation to time 5  Orientation to Place 5  Registration 3  Attention/ Calculation 0  Recall 3  Language- name 2 objects 0  Language- repeat 1  Language- follow 3 step command 3  Language- read & follow direction 0  Write a sentence 0  Copy design 0  Total score 20       PLEASE NOTE: A Mini-Cog screen was completed. Maximum score is 20. A value of 0 denotes this part of Folstein MMSE was not completed or the patient failed this part of the Mini-Cog screening.   Mini-Cog Screening Orientation to Time - Max 5 pts Orientation to Place - Max 5 pts Registration - Max 3 pts Recall - Max 3 pts Language Repeat - Max 1 pts Language Follow 3 Step Command - Max 3 pts   Immunization History  Administered Date(s) Administered  . Influenza Split 06/10/2011, 07/28/2012  . Influenza Whole 06/30/2007, 06/19/2008, 05/29/2010  . Influenza, High Dose Seasonal PF 06/21/2014  . Influenza,inj,Quad PF,36+ Mos 07/31/2013, 07/03/2015, 06/18/2016  . Pneumococcal Conjugate-13 08/09/2014  . Pneumococcal Polysaccharide-23 09/26/2015  . Td 09/21/2003   Screening Tests Health Maintenance  Topic Date Due  . ZOSTAVAX  09/30/2017 (Originally 07/02/2007)  . TETANUS/TDAP  09/20/2023 (Originally 09/20/2013)  . MAMMOGRAM  10/18/2016  . COLONOSCOPY  09/04/2020  . INFLUENZA VACCINE  Completed  . DEXA SCAN  Completed  . Hepatitis C Screening  Completed  . PNA vac Low Risk Adult  Completed      Plan:  I have personally reviewed and addressed the Medicare Annual Wellness questionnaire and have noted the following in the patient's chart:  A. Medical and social history B. Use of alcohol, tobacco or illicit drugs  C. Current medications and supplements D. Functional ability and status E.  Nutritional status F.  Physical activity G. Advance directives H. List of other  physicians I.  Hospitalizations, surgeries, and ER visits in previous 12 months J.  [place] to include hearing, vision, cognitive, depression L. Referrals and appointments - none  In addition, I have reviewed and discussed with patient certain preventive protocols, quality metrics, and best practice recommendations. A written personalized care plan for preventive services as well as general preventive health recommendations were provided to patient.  See attached scanned questionnaire for additional information.   Signed,   Lindell Noe, MHA, BS, LPN Health Coach

## 2016-09-30 NOTE — Progress Notes (Signed)
Pre visit review using our clinic review tool, if applicable. No additional management support is needed unless otherwise documented below in the visit note. 

## 2016-09-30 NOTE — Patient Instructions (Signed)
Tammy Schmidt , Thank you for taking time to come for your Medicare Wellness Visit. I appreciate your ongoing commitment to your health goals. Please review the following plan we discussed and let me know if I can assist you in the future.   These are the goals we discussed: Goals    . Increase physical activity          Starting 09/30/2016, I will continue to exercise at least 30 min 3 days per week.       This is a list of the screening recommended for you and due dates:  Health Maintenance  Topic Date Due  . Shingles Vaccine  09/30/2017*  . Tetanus Vaccine  09/20/2023*  . Mammogram  10/18/2016  . Colon Cancer Screening  09/04/2020  . Flu Shot  Completed  . DEXA scan (bone density measurement)  Completed  .  Hepatitis C: One time screening is recommended by Center for Disease Control  (CDC) for  adults born from [birth year] through 1964.   Completed  . Pneumonia vaccines  Completed  *Topic was postponed. The date shown is not the original due date.   Preventive Care for Adults  A healthy lifestyle and preventive care can promote health and wellness. Preventive health guidelines for adults include the following key practices.  . A routine yearly physical is a good way to check with your health care provider about your health and preventive screening. It is a chance to share any concerns and updates on your health and to receive a thorough exam.  . Visit your dentist for a routine exam and preventive care every 6 months. Brush your teeth twice a day and floss once a day. Good oral hygiene prevents tooth decay and gum disease.  . The frequency of eye exams is based on your age, health, family medical history, use  of contact lenses, and other factors. Follow your health care provider's ecommendations for frequency of eye exams.  . Eat a healthy diet. Foods like vegetables, fruits, whole grains, low-fat dairy products, and lean protein foods contain the nutrients you need without too many  calories. Decrease your intake of foods high in solid fats, added sugars, and salt. Eat the right amount of calories for you. Get information about a proper diet from your health care provider, if necessary.  . Regular physical exercise is one of the most important things you can do for your health. Most adults should get at least 150 minutes of moderate-intensity exercise (any activity that increases your heart rate and causes you to sweat) each week. In addition, most adults need muscle-strengthening exercises on 2 or more days a week.  Silver Sneakers may be a benefit available to you. To determine eligibility, you may visit the website: www.silversneakers.com or contact program at [phone] Mon-Fri between 8AM-8PM.   . Maintain a healthy weight. The body mass index (BMI) is a screening tool to identify possible weight problems. It provides an estimate of body fat based on height and weight. Your health care provider can find your BMI and can help you achieve or maintain a healthy weight.   For adults 20 years and older: ? A BMI below 18.5 is considered underweight. ? A BMI of 18.5 to 24.9 is normal. ? A BMI of 25 to 29.9 is considered overweight. ? A BMI of 30 and above is considered obese.   . Maintain normal blood lipids and cholesterol levels by exercising and minimizing your intake of saturated fat. Eat a balanced  diet with plenty of fruit and vegetables. Blood tests for lipids and cholesterol should begin at age 57 and be repeated every 5 years. If your lipid or cholesterol levels are high, you are over 50, or you are at high risk for heart disease, you may need your cholesterol levels checked more frequently. Ongoing high lipid and cholesterol levels should be treated with medicines if diet and exercise are not working.  . If you smoke, find out from your health care provider how to quit. If you do not use tobacco, please do not start.  . If you choose to drink alcohol, please do  not consume more than 2 drinks per day. One drink is considered to be 12 ounces (355 mL) of beer, 5 ounces (148 mL) of wine, or 1.5 ounces (44 mL) of liquor.  . If you are 3-63 years old, ask your health care provider if you should take aspirin to prevent strokes.  . Use sunscreen. Apply sunscreen liberally and repeatedly throughout the day. You should seek shade when your shadow is shorter than you. Protect yourself by wearing long sleeves, pants, a wide-brimmed hat, and sunglasses year round, whenever you are outdoors.  . Once a month, do a whole body skin exam, using a mirror to look at the skin on your back. Tell your health care provider of new moles, moles that have irregular borders, moles that are larger than a pencil eraser, or moles that have changed in shape or color.

## 2016-10-08 ENCOUNTER — Ambulatory Visit (INDEPENDENT_AMBULATORY_CARE_PROVIDER_SITE_OTHER): Payer: Medicare HMO | Admitting: Family Medicine

## 2016-10-08 ENCOUNTER — Encounter: Payer: Self-pay | Admitting: Family Medicine

## 2016-10-08 VITALS — BP 140/80 | HR 65 | Temp 98.4°F | Ht 63.5 in | Wt 164.8 lb

## 2016-10-08 DIAGNOSIS — E782 Mixed hyperlipidemia: Secondary | ICD-10-CM

## 2016-10-08 DIAGNOSIS — E538 Deficiency of other specified B group vitamins: Secondary | ICD-10-CM

## 2016-10-08 DIAGNOSIS — F411 Generalized anxiety disorder: Secondary | ICD-10-CM

## 2016-10-08 DIAGNOSIS — E559 Vitamin D deficiency, unspecified: Secondary | ICD-10-CM

## 2016-10-08 DIAGNOSIS — R8290 Unspecified abnormal findings in urine: Secondary | ICD-10-CM

## 2016-10-08 DIAGNOSIS — E876 Hypokalemia: Secondary | ICD-10-CM | POA: Insufficient documentation

## 2016-10-08 DIAGNOSIS — I10 Essential (primary) hypertension: Secondary | ICD-10-CM

## 2016-10-08 DIAGNOSIS — R69 Illness, unspecified: Secondary | ICD-10-CM | POA: Diagnosis not present

## 2016-10-08 LAB — POC URINALSYSI DIPSTICK (AUTOMATED)
BILIRUBIN UA: NEGATIVE
Blood, UA: NEGATIVE
Glucose, UA: NEGATIVE
Ketones, UA: NEGATIVE
LEUKOCYTES UA: NEGATIVE
NITRITE UA: NEGATIVE
PH UA: 6
PROTEIN UA: NEGATIVE
Spec Grav, UA: 1.02
Urobilinogen, UA: 0.2

## 2016-10-08 MED ORDER — ATORVASTATIN CALCIUM 40 MG PO TABS: 40.0000 mg | ORAL_TABLET | Freq: Every day | ORAL | 3 refills | Status: DC

## 2016-10-08 NOTE — Assessment & Plan Note (Signed)
Good control on supplement. 

## 2016-10-08 NOTE — Assessment & Plan Note (Signed)
Well- controlled on current medication 

## 2016-10-08 NOTE — Assessment & Plan Note (Signed)
Likely due to HCTZ.. Start low dose potassium every other day.

## 2016-10-08 NOTE — Assessment & Plan Note (Signed)
Well controlled. Continue current medication.  

## 2016-10-08 NOTE — Assessment & Plan Note (Signed)
On current statin still 13.9% AHA  10 year risk.. moderate intensity.  Will change to high intensity stain.. Atorvastatin 40 mg daily.

## 2016-10-08 NOTE — Addendum Note (Signed)
Addended by: Carter Kitten on: 10/08/2016 12:28 PM   Modules accepted: Orders

## 2016-10-08 NOTE — Progress Notes (Signed)
Subjective:    Patient ID: Tammy Schmidt, female    DOB: 01-Jul-1946, 71 y.o.   MRN: [id number]  HPI   71 year old female presents for PART 2 AMW. The patient saw Candis Musa, LPN for medicare wellness. Note reviewed in detail and important notes copied below. Health maintenance:  Shingles - pt needs new prescription Tetanus - pt needs new prescription  Abnormal screenings:  None  Patient concerns:  Pt stated she needs PCP to write new prescriptions for Tetanus and Shingles vaccines because she has misplaced them.   Nurse concerns: None  Hypertension:    Moderate control on lisinopril HCTZ BP Readings from Last 3 Encounters:  10/08/16 140/80  09/30/16 126/84  09/26/15 128/80  Using medication without problems or lightheadedness:  none Chest pain with exertion:none Edema:none Short of breath: none Average home BPs: 130/80s Other issues:  Elevated Cholesterol:  Moderate control on zocor 40 mg daily, pt high risk CVD  strong family history Lab Results  Component Value Date   CHOL 232 (H) 09/30/2016   HDL 59.70 09/30/2016   LDLCALC 134 (H) 09/30/2016   LDLDIRECT 129.5 07/24/2013   TRIG 191.0 (H) 09/30/2016   CHOLHDL 4 09/30/2016  Using medications without problems: none Muscle aches: None Diet compliance: moderate, eating out a lot Exercise: working on increasing... 3 times a week. Other complaints:  GAD: stable control on sertraline 100 mg daily.  Vit d and b12 good control on supplement.   Urine with odor in last 1-2 weeks.  No dysuria, no hematuria, minimal change in frequency or urgency.  Social History /Family History/Past Medical History reviewed and updated if needed.   Review of Systems  Constitutional: Negative for fatigue and fever.  HENT: Negative for congestion.   Eyes: Negative for pain.  Respiratory: Negative for cough and shortness of breath.   Cardiovascular: Negative for chest pain, palpitations and leg swelling.    Gastrointestinal: Negative for abdominal pain.  Genitourinary: Negative for dysuria and vaginal bleeding.  Musculoskeletal: Positive for back pain.  Neurological: Negative for syncope, light-headedness and headaches.  Psychiatric/Behavioral: Negative for dysphoric mood.       Objective:   Physical Exam  Constitutional: Vital signs are normal. She appears well-developed and well-nourished. She is cooperative.  Non-toxic appearance. She does not appear ill. No distress.  HENT:  Head: Normocephalic.  Right Ear: Hearing, tympanic membrane, external ear and ear canal normal.  Left Ear: Hearing, tympanic membrane, external ear and ear canal normal.  Nose: Nose normal.  Eyes: Conjunctivae, EOM and lids are normal. Pupils are equal, round, and reactive to light. Lids are everted and swept, no foreign bodies found.  Neck: Trachea normal and normal range of motion. Neck supple. Carotid bruit is not present. No thyroid mass and no thyromegaly present.  Cardiovascular: Normal rate, regular rhythm, S1 normal, S2 normal, normal heart sounds and intact distal pulses.  Exam reveals no gallop.   No murmur heard. Pulmonary/Chest: Effort normal and breath sounds normal. No respiratory distress. She has no wheezes. She has no rhonchi. She has no rales.  Abdominal: Soft. Normal appearance and bowel sounds are normal. She exhibits no distension, no fluid wave, no abdominal bruit and no mass. There is no hepatosplenomegaly. There is no tenderness. There is no rebound, no guarding and no CVA tenderness. No hernia.  Lymphadenopathy:    She has no cervical adenopathy.    She has no axillary adenopathy.  Neurological: She is alert. She has normal strength. No cranial nerve  deficit or sensory deficit.  Skin: Skin is warm, dry and intact. No rash noted.  Psychiatric: Her speech is normal and behavior is normal. Judgment normal. Her mood appears not anxious. Cognition and memory are normal. She does not exhibit a  depressed mood.          Assessment & Plan:  The patient's preventative maintenance and recommended screening tests for an annual wellness exam were reviewed in full today. Brought up to date unless services declined.  Counselled on the importance of diet, exercise, and its role in overall health and mortality. The patient's FH and SH was reviewed, including their home life, tobacco status, and drug and alcohol status.   Mammo: 09/2014 , repeat q2 years , due DEXA: 09/2012 Mild osteopenia, spine density somewhat improved since last check! Continue weight bearing exercise and Ca and vit D. Recheck in 5 years 2019.     DVE, PAP: DVE yearly, no pap indicated, no family history uterine or ovarian cancer, no vag symptoms. Colon: polyp 2011... Repeat 10 years  Vaccines: uptodate except shingles and tdap.

## 2016-10-08 NOTE — Patient Instructions (Addendum)
Start potassium every other day.  Return for lab only potassium check in 3-4 weeks.  Stop simvastatin and change to atorvastatin 40 mg daily

## 2016-10-08 NOTE — Progress Notes (Signed)
Pre visit review using our clinic review tool, if applicable. No additional management support is needed unless otherwise documented below in the visit note. 

## 2016-10-08 NOTE — Assessment & Plan Note (Signed)
No other symptoms.. Check UA. Likely due to decreased fluids, food or med change. Increase water intake.

## 2016-10-20 DIAGNOSIS — Z1231 Encounter for screening mammogram for malignant neoplasm of breast: Secondary | ICD-10-CM | POA: Diagnosis not present

## 2016-10-21 ENCOUNTER — Encounter: Payer: Self-pay | Admitting: Family Medicine

## 2016-11-04 ENCOUNTER — Telehealth: Payer: Self-pay | Admitting: Family Medicine

## 2016-11-04 DIAGNOSIS — E876 Hypokalemia: Secondary | ICD-10-CM

## 2016-11-04 NOTE — Addendum Note (Signed)
Addended by: Ellamae Sia on: 11/04/2016 10:58 AM   Modules accepted: Orders

## 2016-11-04 NOTE — Telephone Encounter (Signed)
-----   Message from Ellamae Sia sent at 10/29/2016 10:35 AM EST ----- Regarding: Lab orders for Friday, 2.16.18 Lab orders for a 4 week f/u

## 2016-11-05 ENCOUNTER — Other Ambulatory Visit (INDEPENDENT_AMBULATORY_CARE_PROVIDER_SITE_OTHER): Payer: Medicare HMO

## 2016-11-05 DIAGNOSIS — E876 Hypokalemia: Secondary | ICD-10-CM

## 2016-11-05 LAB — BASIC METABOLIC PANEL
BUN: 16 mg/dL (ref 6–23)
CALCIUM: 9.4 mg/dL (ref 8.4–10.5)
CO2: 32 mEq/L (ref 19–32)
Chloride: 101 mEq/L (ref 96–112)
Creatinine, Ser: 0.89 mg/dL (ref 0.40–1.20)
GFR: 66.77 mL/min (ref >60.00)
Glucose, Bld: 95 mg/dL (ref 70–99)
POTASSIUM: 3.3 meq/L — AB (ref 3.5–5.1)
Sodium: 141 mEq/L (ref 135–145)

## 2016-11-08 ENCOUNTER — Telehealth: Payer: Self-pay | Admitting: *Deleted

## 2016-11-08 ENCOUNTER — Other Ambulatory Visit: Payer: Self-pay | Admitting: Family Medicine

## 2016-11-08 MED ORDER — POTASSIUM CHLORIDE CRYS ER 20 MEQ PO TBCR: 20.0000 meq | EXTENDED_RELEASE_TABLET | Freq: Every day | ORAL | 0 refills | Status: DC

## 2016-11-08 NOTE — Telephone Encounter (Signed)
-----   Message from [name], MD sent at 11/08/2016  4:52 PM EST ----- Have her stop that and send in a prescription for potassium chloride 20 meq.Tammy Schmidt Have her take this daily and return for recheck labs in 1 week. Thanks.

## 2016-11-08 NOTE — Telephone Encounter (Signed)
Tammy Schmidt notified as instructed by telephone.  K+ prescription sent into [facility] as instructed by Dr. Diona Browner. Lab appointment scheduled for 11/16/16 at 10:30 am for BMET.

## 2016-11-16 ENCOUNTER — Other Ambulatory Visit: Payer: Medicare HMO

## 2016-11-17 ENCOUNTER — Other Ambulatory Visit (INDEPENDENT_AMBULATORY_CARE_PROVIDER_SITE_OTHER): Payer: Medicare HMO

## 2016-11-17 DIAGNOSIS — E876 Hypokalemia: Secondary | ICD-10-CM | POA: Diagnosis not present

## 2016-11-17 LAB — BASIC METABOLIC PANEL
BUN: 12 mg/dL (ref 6–23)
CHLORIDE: 101 meq/L (ref 96–112)
CO2: 32 meq/L (ref 19–32)
CREATININE: 0.91 mg/dL (ref 0.40–1.20)
Calcium: 10.3 mg/dL (ref 8.4–10.5)
GFR: 65.07 mL/min (ref >60.00)
GLUCOSE: 115 mg/dL — AB (ref 70–99)
POTASSIUM: 3.7 meq/L (ref 3.5–5.1)
Sodium: 139 mEq/L (ref 135–145)

## 2016-11-18 ENCOUNTER — Other Ambulatory Visit: Payer: Medicare HMO

## 2016-11-19 ENCOUNTER — Telehealth: Payer: Self-pay | Admitting: Family Medicine

## 2016-11-19 DIAGNOSIS — E538 Deficiency of other specified B group vitamins: Secondary | ICD-10-CM

## 2016-11-19 DIAGNOSIS — E559 Vitamin D deficiency, unspecified: Secondary | ICD-10-CM

## 2016-11-19 DIAGNOSIS — E782 Mixed hyperlipidemia: Secondary | ICD-10-CM

## 2016-11-19 NOTE — Telephone Encounter (Signed)
Labs ordered.

## 2016-11-22 ENCOUNTER — Other Ambulatory Visit: Payer: Self-pay | Admitting: Family Medicine

## 2016-12-27 ENCOUNTER — Other Ambulatory Visit: Payer: Self-pay | Admitting: Family Medicine

## 2017-01-03 DIAGNOSIS — R69 Illness, unspecified: Secondary | ICD-10-CM | POA: Diagnosis not present

## 2017-01-06 ENCOUNTER — Other Ambulatory Visit (INDEPENDENT_AMBULATORY_CARE_PROVIDER_SITE_OTHER): Payer: Medicare HMO

## 2017-01-06 DIAGNOSIS — E782 Mixed hyperlipidemia: Secondary | ICD-10-CM | POA: Diagnosis not present

## 2017-01-06 LAB — LIPID PANEL
CHOLESTEROL: 172 mg/dL (ref 0–200)
HDL: 54.9 mg/dL (ref >39.00)
LDL CALC: 92 mg/dL (ref 0–99)
NonHDL: 117.49
Total CHOL/HDL Ratio: 3
Triglycerides: 126 mg/dL (ref 0.0–149.0)
VLDL: 25.2 mg/dL (ref 0.0–40.0)

## 2017-01-07 ENCOUNTER — Other Ambulatory Visit: Payer: Medicare HMO

## 2017-06-07 ENCOUNTER — Other Ambulatory Visit: Payer: Self-pay | Admitting: Family Medicine

## 2017-06-09 ENCOUNTER — Ambulatory Visit (INDEPENDENT_AMBULATORY_CARE_PROVIDER_SITE_OTHER): Payer: Medicare HMO

## 2017-06-09 DIAGNOSIS — Z23 Encounter for immunization: Secondary | ICD-10-CM | POA: Diagnosis not present

## 2017-07-14 ENCOUNTER — Other Ambulatory Visit: Payer: Self-pay | Admitting: Family Medicine

## 2017-09-07 DIAGNOSIS — H5231 Anisometropia: Secondary | ICD-10-CM | POA: Diagnosis not present

## 2017-09-07 DIAGNOSIS — H524 Presbyopia: Secondary | ICD-10-CM | POA: Diagnosis not present

## 2017-09-07 DIAGNOSIS — H2513 Age-related nuclear cataract, bilateral: Secondary | ICD-10-CM | POA: Diagnosis not present

## 2017-09-07 DIAGNOSIS — H25042 Posterior subcapsular polar age-related cataract, left eye: Secondary | ICD-10-CM | POA: Diagnosis not present

## 2017-09-30 DIAGNOSIS — Z0101 Encounter for examination of eyes and vision with abnormal findings: Secondary | ICD-10-CM | POA: Diagnosis not present

## 2017-10-03 ENCOUNTER — Telehealth: Payer: Self-pay | Admitting: Family Medicine

## 2017-10-03 DIAGNOSIS — E782 Mixed hyperlipidemia: Secondary | ICD-10-CM

## 2017-10-03 DIAGNOSIS — E538 Deficiency of other specified B group vitamins: Secondary | ICD-10-CM

## 2017-10-03 DIAGNOSIS — E559 Vitamin D deficiency, unspecified: Secondary | ICD-10-CM

## 2017-10-03 NOTE — Telephone Encounter (Signed)
-----   Message from Eustace Pen, LPN sent at 9/60/4540  4:31 PM EST ----- Regarding: Labs 1/15 Lab orders needed. Thank you.  Insurance:  [employer]

## 2017-10-04 ENCOUNTER — Ambulatory Visit (INDEPENDENT_AMBULATORY_CARE_PROVIDER_SITE_OTHER): Payer: Medicare HMO

## 2017-10-04 ENCOUNTER — Encounter: Payer: Self-pay | Admitting: Family Medicine

## 2017-10-04 ENCOUNTER — Ambulatory Visit (INDEPENDENT_AMBULATORY_CARE_PROVIDER_SITE_OTHER): Payer: Medicare HMO | Admitting: Family Medicine

## 2017-10-04 VITALS — BP 177/78 | HR 84 | Temp 97.8°F | Ht 63.5 in | Wt 164.5 lb

## 2017-10-04 VITALS — BP 144/90 | HR 93 | Temp 97.8°F | Ht 63.5 in | Wt 164.5 lb

## 2017-10-04 DIAGNOSIS — E559 Vitamin D deficiency, unspecified: Secondary | ICD-10-CM | POA: Diagnosis not present

## 2017-10-04 DIAGNOSIS — E538 Deficiency of other specified B group vitamins: Secondary | ICD-10-CM | POA: Diagnosis not present

## 2017-10-04 DIAGNOSIS — E782 Mixed hyperlipidemia: Secondary | ICD-10-CM

## 2017-10-04 DIAGNOSIS — Z0000 Encounter for general adult medical examination without abnormal findings: Secondary | ICD-10-CM | POA: Diagnosis not present

## 2017-10-04 DIAGNOSIS — J029 Acute pharyngitis, unspecified: Secondary | ICD-10-CM

## 2017-10-04 LAB — LIPID PANEL
CHOL/HDL RATIO: 4
Cholesterol: 177 mg/dL (ref 0–200)
HDL: 49 mg/dL (ref >39.00)
LDL CALC: 93 mg/dL (ref 0–99)
NONHDL: 128.23
Triglycerides: 176 mg/dL — ABNORMAL HIGH (ref 0.0–149.0)
VLDL: 35.2 mg/dL (ref 0.0–40.0)

## 2017-10-04 LAB — COMPREHENSIVE METABOLIC PANEL
ALBUMIN: 4.4 g/dL (ref 3.5–5.2)
ALT: 20 U/L (ref 0–35)
AST: 21 U/L (ref 0–37)
Alkaline Phosphatase: 25 U/L — ABNORMAL LOW (ref 39–117)
BUN: 13 mg/dL (ref 6–23)
CHLORIDE: 98 meq/L (ref 96–112)
CO2: 34 meq/L — AB (ref 19–32)
CREATININE: 0.89 mg/dL (ref 0.40–1.20)
Calcium: 9.9 mg/dL (ref 8.4–10.5)
GFR: 66.59 mL/min (ref >60.00)
GLUCOSE: 108 mg/dL — AB (ref 70–99)
POTASSIUM: 3.9 meq/L (ref 3.5–5.1)
SODIUM: 139 meq/L (ref 135–145)
Total Bilirubin: 0.9 mg/dL (ref 0.2–1.2)
Total Protein: 7.8 g/dL (ref 6.0–8.3)

## 2017-10-04 LAB — VITAMIN B12: VITAMIN B 12: 543 pg/mL (ref 211–911)

## 2017-10-04 LAB — VITAMIN D 25 HYDROXY (VIT D DEFICIENCY, FRACTURES): VITD: 37.9 ng/mL (ref 30.00–100.00)

## 2017-10-04 LAB — POCT RAPID STREP A (OFFICE): RAPID STREP A SCREEN: NEGATIVE

## 2017-10-04 NOTE — Progress Notes (Signed)
I reviewed health advisor's note, was available for consultation, and agree with documentation and plan.  

## 2017-10-04 NOTE — Patient Instructions (Signed)
Follow BP at home.. Goal < 140 /90.  Avoid decongestants.  Start nasal flonase 2 spray per nostril daily to decrease post nasal drip.  Continue humidifier.  Call if new fever or shortness of breath.

## 2017-10-04 NOTE — Progress Notes (Signed)
Subjective:   Neiva Maenza is a 72 y.o. female who presents for Medicare Annual (Subsequent) preventive examination.  Review of Systems:  N/A Cardiac Risk Factors include: advanced age (>19men, >52 women);dyslipidemia;hypertension     Objective:     Vitals: BP (!) 144/90 (BP Location: Right Arm, Patient Position: Sitting, Cuff Size: Normal)   Pulse 93   Temp 97.8 F (36.6 C) (Oral)   Ht 5' 3.5" (1.613 m) Comment: no shoes  Wt 164 lb 8 oz (74.6 kg)   SpO2 98%   BMI 28.68 kg/m   Body mass index is 28.68 kg/m.  Advanced Directives 10/04/2017 09/30/2016  Does Patient Have a Medical Advance Directive? No No  Would patient like information on creating a medical advance directive? No - Patient declined -    Tobacco Social History   Tobacco Use  Smoking Status Former Smoker  Smokeless Tobacco Never Used     Counseling given: No   Clinical Intake:  Pre-visit preparation completed: Yes  Pain : 0-10 Pain Score: 4  Pain Location: Throat Pain Onset: 1 to 4 weeks ago Pain Frequency: Intermittent     Nutritional Status: BMI 25 -29 Overweight Nutritional Risks: None Diabetes: No  How often do you need to have someone help you when you read instructions, pamphlets, or other written materials from your doctor or pharmacy?: 1 - Never What is the last grade level you completed in school?: 12th grade  Interpreter Needed?: No  Comments: pt lives with spouse Information entered by :: LPinson, LPN  Past Medical History:  Diagnosis Date  . Anxiety state, unspecified   . Disorder of bone and cartilage, unspecified   . Dyspepsia and other specified disorders of function of stomach   . Elevated blood pressure reading without diagnosis of hypertension   . Encounter for long-term (current) use of other medications   . Other and unspecified hyperlipidemia   . Other specified cardiac dysrhythmias(427.89)    Past Surgical History:  Procedure Laterality Date  .  CARDIOVASCULAR STRESS TEST  11-2007   normal  . DOPPLER ECHOCARDIOGRAPHY  11-2007   normal  . LAPAROSCOPY FOR ECTOPIC PREGNANCY     removed fallopian tube  . TONSILLECTOMY     Family History  Problem Relation Age of Onset  . Heart attack Mother   . Heart attack Father   . Coronary artery disease Sister   . Hyperlipidemia Brother   . Hypertension Brother   . Hodgkin's lymphoma Sister   . Hypertension Sister   . Diabetes Sister   . Heart attack Sister    Social History   Socioeconomic History  . Marital status: Married    Spouse name: None  . Number of children: 1  . Years of education: None  . Highest education level: None  Social Needs  . Financial resource strain: None  . Food insecurity - worry: None  . Food insecurity - inability: None  . Transportation needs - medical: None  . Transportation needs - non-medical: None  Occupational History  . Occupation: RETIRED OFFICE WORKER  Tobacco Use  . Smoking status: Former Research scientist (life sciences)  . Smokeless tobacco: Never Used  Substance and Sexual Activity  . Alcohol use: Yes  . Drug use: No  . Sexual activity: None  Other Topics Concern  . None  Social History Narrative   Regular exercise-yes-walks   Diet: Some fruit and veggies, H2O    Does not have living will or HCPOA, full code (reviewed 2014)    Outpatient  Encounter Medications as of 10/04/2017  Medication Sig  . aspirin 81 MG tablet Take 81 mg by mouth daily.    Marland Kitchen atorvastatin (LIPITOR) 40 MG tablet Take 1 tablet (40 mg total) by mouth daily.  . calcium-vitamin D (OSCAL WITH D) 500-200 MG-UNIT per tablet Take 1 tablet by mouth daily.    . Cholecalciferol (VITAMIN D3) 400 units CAPS Take 1 capsule by mouth daily.   . Cyanocobalamin (VITAMIN B 12 PO) Take by mouth daily.  Marland Kitchen lisinopril-hydrochlorothiazide (PRINZIDE,ZESTORETIC) 20-25 MG tablet TAKE 1 TABLET BY MOUTH ONCE DAILY  . Multiple Vitamin (MULTIVITAMIN) tablet Take 1 tablet by mouth daily.  . potassium chloride SA  (K-DUR,KLOR-CON) 20 MEQ tablet Take 1 tablet (20 mEq total) by mouth daily.  . sertraline (ZOLOFT) 100 MG tablet TAKE 1 TABLET BY MOUTH ONCE DAILY.   No facility-administered encounter medications on file as of 10/04/2017.     Activities of Daily Living In your present state of health, do you have any difficulty performing the following activities: 10/04/2017  Hearing? N  Vision? N  Difficulty concentrating or making decisions? N  Walking or climbing stairs? N  Dressing or bathing? N  Doing errands, shopping? N  Preparing Food and eating ? N  Using the Toilet? N  In the past six months, have you accidently leaked urine? Y  Comment coughing and sneezing causes leakage  Do you have problems with loss of bowel control? N  Managing your Medications? N  Managing your Finances? N  Housekeeping or managing your Housekeeping? N  Some recent data might be hidden    Patient Care Team: [name], MD as PCP - General Thelma Comp, OD as Consulting Physician (Optometry)    Assessment:   This is a routine wellness examination for [place].   Hearing Screening   125Hz  250Hz  500Hz  1000Hz  2000Hz  3000Hz  4000Hz  6000Hz  8000Hz   Right ear:   40 40 40  40    Left ear:   40 40 40  0    Vision Screening Comments: Last vision exam in Dec 2018 with Dr. Maryruth Hancock B.     Exercise Activities and Dietary recommendations Current Exercise Habits: The patient does not participate in regular exercise at present, Exercise limited by: None identified  Goals    . Follow up with Primary Care Provider     Starting 10/04/2017, I will continue to take medications as prescribed and to keep appointments with PCP as scheduled.        Fall Risk Fall Risk  10/04/2017 09/30/2016 08/09/2014 07/31/2013  Falls in the past year? No No No No   Depression Screen PHQ 2/9 Scores 10/04/2017 09/30/2016 08/09/2014 07/31/2013  PHQ - 2 Score 0 0 0 0  PHQ- 9 Score 0 - - -     Cognitive Function MMSE - Mini Mental  State Exam 10/04/2017 09/30/2016  Orientation to time 5 5  Orientation to Place 5 5  Registration 3 3  Attention/ Calculation 0 0  Recall 3 3  Language- name 2 objects 0 0  Language- repeat 1 1  Language- follow 3 step command 3 3  Language- read & follow direction 0 0  Write a sentence 0 0  Copy design 0 0  Total score 20 20     PLEASE NOTE: A Mini-Cog screen was completed. Maximum score is 20. A value of 0 denotes this part of Folstein MMSE was not completed or the patient failed this part of the Mini-Cog screening.   Mini-Cog  Screening Orientation to Time - Max 5 pts Orientation to Place - Max 5 pts Registration - Max 3 pts Recall - Max 3 pts Language Repeat - Max 1 pts Language Follow 3 Step Command - Max 3 pts     Immunization History  Administered Date(s) Administered  . Influenza Split 06/10/2011, 07/28/2012  . Influenza Whole 06/30/2007, 06/19/2008, 05/29/2010  . Influenza, High Dose Seasonal PF 06/21/2014  . Influenza,inj,Quad PF,6+ Mos 07/31/2013, 07/03/2015, 06/18/2016, 06/09/2017  . Pneumococcal Conjugate-13 08/09/2014  . Pneumococcal Polysaccharide-23 09/26/2015  . Td 09/21/2003  . Tdap 01/19/2017  . Zoster Recombinat (Shingrix) 01/19/2017, 05/17/2017    Screening Tests Health Maintenance  Topic Date Due  . MAMMOGRAM  10/20/2018  . COLONOSCOPY  09/04/2020  . TETANUS/TDAP  01/20/2027  . INFLUENZA VACCINE  Completed  . DEXA SCAN  Completed  . Hepatitis C Screening  Completed  . PNA vac Low Risk Adult  Completed       Plan:     I have personally reviewed, addressed, and noted the following in the patient's chart:  A. Medical and social history B. Use of alcohol, tobacco or illicit drugs  C. Current medications and supplements D. Functional ability and status E.  Nutritional status F.  Physical activity G. Advance directives H. List of other physicians I.  Hospitalizations, surgeries, and ER visits in previous 12 months J.  Gray  to include hearing, vision, cognitive, depression L. Referrals and appointments - none  In addition, I have reviewed and discussed with patient certain preventive protocols, quality metrics, and best practice recommendations. A written personalized care plan for preventive services as well as general preventive health recommendations were provided to patient.  See attached scanned questionnaire for additional information.   Signed,   Lindell Noe, MHA, BS, LPN Health Coach

## 2017-10-04 NOTE — Progress Notes (Signed)
Pre visit review using our clinic review tool, if applicable. No additional management support is needed unless otherwise documented below in the visit note. 

## 2017-10-04 NOTE — Addendum Note (Signed)
Addended by: Carter Kitten on: 10/04/2017 01:09 PM   Modules accepted: Orders

## 2017-10-04 NOTE — Progress Notes (Signed)
   Subjective:    Patient ID: Tammy Schmidt, female    DOB: 1946/03/26, 72 y.o.   MRN: [id number]  Cough  This is a new problem. The problem has been gradually improving. The cough is non-productive. Associated symptoms include nasal congestion, postnasal drip and a sore throat. Pertinent negatives include no chills, ear congestion, ear pain, fever, hemoptysis, myalgias, shortness of breath or wheezing. Associated symptoms comments:  Dry throat  cough has improved. Risk factors: nonsmoker. Treatments tried:  using humidifier, tylenol, took robitussin last night ( had decongestant) The treatment provided mild relief. There is no history of asthma, bronchiectasis, bronchitis, COPD, emphysema, environmental allergies or pneumonia.  Sore Throat   Associated symptoms include coughing. Pertinent negatives include no ear pain or shortness of breath.    sick contract: grandson  BP Readings from Last 3 Encounters:  10/04/17 (!) 177/78  10/04/17 (!) 144/90  10/08/16 140/80      Review of Systems  Constitutional: Negative for chills and fever.  HENT: Positive for postnasal drip and sore throat. Negative for ear pain.   Respiratory: Positive for cough. Negative for hemoptysis, shortness of breath and wheezing.   Musculoskeletal: Negative for myalgias.  Allergic/Immunologic: Negative for environmental allergies.       Objective:   Physical Exam  Constitutional: Vital signs are normal. She appears well-developed and well-nourished. She is cooperative.  Non-toxic appearance. She does not appear ill. No distress.  HENT:  Head: Normocephalic.  Right Ear: Hearing, tympanic membrane, external ear and ear canal normal. Tympanic membrane is not erythematous, not retracted and not bulging.  Left Ear: Hearing, tympanic membrane, external ear and ear canal normal. Tympanic membrane is not erythematous, not retracted and not bulging.  Nose: Mucosal edema and rhinorrhea present. Right sinus exhibits no  maxillary sinus tenderness and no frontal sinus tenderness. Left sinus exhibits no maxillary sinus tenderness and no frontal sinus tenderness.  Mouth/Throat: Uvula is midline and mucous membranes are normal. Posterior oropharyngeal erythema present.  Eyes: Conjunctivae, EOM and lids are normal. Pupils are equal, round, and reactive to light. Lids are everted and swept, no foreign bodies found.  Neck: Trachea normal and normal range of motion. Neck supple. Carotid bruit is not present. No thyroid mass and no thyromegaly present.  Cardiovascular: Normal rate, regular rhythm, S1 normal, S2 normal, normal heart sounds, intact distal pulses and normal pulses. Exam reveals no gallop and no friction rub.  No murmur heard. Pulmonary/Chest: Effort normal and breath sounds normal. No tachypnea. No respiratory distress. She has no decreased breath sounds. She has no wheezes. She has no rhonchi. She has no rales.  Neurological: She is alert.  Skin: Skin is warm, dry and intact. No rash noted.  Psychiatric: Her speech is normal and behavior is normal. Judgment normal. Her mood appears not anxious. Cognition and memory are normal. She does not exhibit a depressed mood.          Assessment & Plan:

## 2017-10-04 NOTE — Assessment & Plan Note (Signed)
Doubt  Strep.. Pt requested test.  Likely viral infection, resolving and throat irritation for PND.. Treat with nasal steroid spray   Avoid decongestants as may be raising BP some.

## 2017-10-04 NOTE — Patient Instructions (Addendum)
Tammy Schmidt , Thank you for taking time to come for your Medicare Wellness Visit. I appreciate your ongoing commitment to your health goals. Please review the following plan we discussed and let me know if I can assist you in the future.   These are the goals we discussed: Goals    . Follow up with Primary Care Provider     Starting 10/04/2017, I will continue to take medications as prescribed and to keep appointments with PCP as scheduled.        This is a list of the screening recommended for you and due dates:  Health Maintenance  Topic Date Due  . Mammogram  10/20/2018  . Colon Cancer Screening  09/04/2020  . Tetanus Vaccine  01/20/2027  . Flu Shot  Completed  . DEXA scan (bone density measurement)  Completed  .  Hepatitis C: One time screening is recommended by Center for Disease Control  (CDC) for  adults born from [birth year] through 1964.   Completed  . Pneumonia vaccines  Completed   Preventive Care for Adults  A healthy lifestyle and preventive care can promote health and wellness. Preventive health guidelines for adults include the following key practices.  . A routine yearly physical is a good way to check with your health care provider about your health and preventive screening. It is a chance to share any concerns and updates on your health and to receive a thorough exam.  . Visit your dentist for a routine exam and preventive care every 6 months. Brush your teeth twice a day and floss once a day. Good oral hygiene prevents tooth decay and gum disease.  . The frequency of eye exams is based on your age, health, family medical history, use  of contact lenses, and other factors. Follow your health care provider's recommendations for frequency of eye exams.  . Eat a healthy diet. Foods like vegetables, fruits, whole grains, low-fat dairy products, and lean protein foods contain the nutrients you need without too many calories. Decrease your intake of foods high in solid fats,  added sugars, and salt. Eat the right amount of calories for you. Get information about a proper diet from your health care provider, if necessary.  . Regular physical exercise is one of the most important things you can do for your health. Most adults should get at least 150 minutes of moderate-intensity exercise (any activity that increases your heart rate and causes you to sweat) each week. In addition, most adults need muscle-strengthening exercises on 2 or more days a week.  Silver Sneakers may be a benefit available to you. To determine eligibility, you may visit the website: www.silversneakers.com or contact program at 530-552-0778 Mon-Fri between 8AM-8PM.   . Maintain a healthy weight. The body mass index (BMI) is a screening tool to identify possible weight problems. It provides an estimate of body fat based on height and weight. Your health care provider can find your BMI and can help you achieve or maintain a healthy weight.   For adults 20 years and older: ? A BMI below 18.5 is considered underweight. ? A BMI of 18.5 to 24.9 is normal. ? A BMI of 25 to 29.9 is considered overweight. ? A BMI of 30 and above is considered obese.   . Maintain normal blood lipids and cholesterol levels by exercising and minimizing your intake of saturated fat. Eat a balanced diet with plenty of fruit and vegetables. Blood tests for lipids and cholesterol should begin at age  20 and be repeated every 5 years. If your lipid or cholesterol levels are high, you are over 50, or you are at high risk for heart disease, you may need your cholesterol levels checked more frequently. Ongoing high lipid and cholesterol levels should be treated with medicines if diet and exercise are not working.  . If you smoke, find out from your health care provider how to quit. If you do not use tobacco, please do not start.  . If you choose to drink alcohol, please do not consume more than 2 drinks per day. One drink is  considered to be 12 ounces (355 mL) of beer, 5 ounces (148 mL) of wine, or 1.5 ounces (44 mL) of liquor.  . If you are 58-33 years old, ask your health care provider if you should take aspirin to prevent strokes.  . Use sunscreen. Apply sunscreen liberally and repeatedly throughout the day. You should seek shade when your shadow is shorter than you. Protect yourself by wearing long sleeves, pants, a wide-brimmed hat, and sunglasses year round, whenever you are outdoors.  . Once a month, do a whole body skin exam, using a mirror to look at the skin on your back. Tell your health care provider of new moles, moles that have irregular borders, moles that are larger than a pencil eraser, or moles that have changed in shape or color.

## 2017-10-04 NOTE — Progress Notes (Signed)
PCP notes:   Health maintenance:  No gaps identified.  Abnormal screenings:   Hearing - failed  Hearing Screening   125Hz  250Hz  500Hz  1000Hz  2000Hz  3000Hz  4000Hz  6000Hz  8000Hz   Right ear:   40 40 40  40    Left ear:   40 40 40  0     Patient concerns:   URI concerns with sore throat - same day appt scheduled with PCP  Nurse concerns:  None  Next PCP appt:   10/04/17 @ 1200

## 2017-10-10 ENCOUNTER — Encounter: Payer: Self-pay | Admitting: *Deleted

## 2017-10-11 ENCOUNTER — Ambulatory Visit (INDEPENDENT_AMBULATORY_CARE_PROVIDER_SITE_OTHER): Payer: Medicare HMO | Admitting: Family Medicine

## 2017-10-11 ENCOUNTER — Other Ambulatory Visit: Payer: Self-pay

## 2017-10-11 ENCOUNTER — Encounter: Payer: Self-pay | Admitting: Family Medicine

## 2017-10-11 VITALS — BP 158/70 | HR 99 | Temp 97.8°F | Ht 63.5 in | Wt 164.8 lb

## 2017-10-11 DIAGNOSIS — E782 Mixed hyperlipidemia: Secondary | ICD-10-CM | POA: Diagnosis not present

## 2017-10-11 DIAGNOSIS — I10 Essential (primary) hypertension: Secondary | ICD-10-CM | POA: Diagnosis not present

## 2017-10-11 DIAGNOSIS — F411 Generalized anxiety disorder: Secondary | ICD-10-CM

## 2017-10-11 DIAGNOSIS — Z0001 Encounter for general adult medical examination with abnormal findings: Secondary | ICD-10-CM

## 2017-10-11 DIAGNOSIS — M8580 Other specified disorders of bone density and structure, unspecified site: Secondary | ICD-10-CM | POA: Diagnosis not present

## 2017-10-11 DIAGNOSIS — J029 Acute pharyngitis, unspecified: Secondary | ICD-10-CM

## 2017-10-11 DIAGNOSIS — E559 Vitamin D deficiency, unspecified: Secondary | ICD-10-CM

## 2017-10-11 DIAGNOSIS — Z0000 Encounter for general adult medical examination without abnormal findings: Secondary | ICD-10-CM

## 2017-10-11 DIAGNOSIS — R69 Illness, unspecified: Secondary | ICD-10-CM | POA: Diagnosis not present

## 2017-10-11 NOTE — Assessment & Plan Note (Signed)
Due for re-peat bone density 

## 2017-10-11 NOTE — Patient Instructions (Addendum)
Follow BP at home... Call if > 140/90 more than 3 measurement.  Get back on track with healthy eating, work increasing exercise.  Please stop at the front desk to set up referral.

## 2017-10-11 NOTE — Progress Notes (Signed)
Subjective:    Patient ID: Tammy Schmidt, female    DOB: August 29, 1946, 72 y.o.   MRN: [id number]  HPI   The patient presents for annual medicare wellness, complete physical and review of chronic health problems. He/She also has the following acute concerns today:   The patient saw Candis Musa, LPN for medicare wellness. Note reviewed in detail and important notes copied below. No gaps identified. Abnormal screenings:  Hearing - failed             Hearing Screening   125Hz  250Hz  500Hz  1000Hz  2000Hz  3000Hz  4000Hz  6000Hz  8000Hz   Right ear:   40 40 40  40    Left ear:   40 40 40  0      Today 10/11/17    Her sore throat.  Hypertension:    Inadequate control in office today on lisinopril HCTZ  HAs white coat, anxiety BP Readings from Last 3 Encounters:  10/11/17 (!) 158/70  10/04/17 (!) 177/78  10/04/17 (!) 144/90  Using medication without problems or lightheadedness:  none Chest pain with exertion: none  Edema:none Short of breath: none Average home BPs: At home 135-140/70-78 Other issues:  Elevated Cholesterol:  LDL at goal on  lipitor 40 mg daily Lab Results  Component Value Date   CHOL 177 10/04/2017   HDL 49.00 10/04/2017   LDLCALC 93 10/04/2017   LDLDIRECT 129.5 07/24/2013   TRIG 176.0 (H) 10/04/2017   CHOLHDL 4 10/04/2017  Using medications without problems: Muscle aches:  Diet compliance: moderate Exercise: none Other complaints:  GAD, good control. Sleeping well.  On sertraline 100 mg daily  Depression screen [facility] 2/9 10/04/2017 09/30/2016 08/09/2014  Decreased Interest 0 0 0  Down, Depressed, Hopeless 0 0 0  PHQ - 2 Score 0 0 0  Altered sleeping 0 - -  Tired, decreased energy 0 - -  Change in appetite 0 - -  Feeling bad or failure about yourself  0 - -  Trouble concentrating 0 - -  Moving slowly or fidgety/restless 0 - -  Suicidal thoughts 0 - -  PHQ-9 Score 0 - -  Difficult doing work/chores Not difficult at all - -    Social  History /Family History/Past Medical History reviewed in detail and updated in EMR if needed. Blood pressure (!) 158/70, pulse 99, temperature 97.8 F (36.6 C), temperature source Oral, height 5' 3.5" (1.613 m), weight 164 lb 12 oz (74.7 kg).   Review of Systems  Constitutional: Negative for fatigue and fever.  HENT: Negative for congestion.   Eyes: Negative for pain.  Respiratory: Negative for cough and shortness of breath.   Cardiovascular: Negative for chest pain, palpitations and leg swelling.  Gastrointestinal: Negative for abdominal pain.  Genitourinary: Negative for dysuria and vaginal bleeding.  Musculoskeletal: Negative for back pain.  Neurological: Negative for syncope, light-headedness and headaches.  Psychiatric/Behavioral: Negative for dysphoric mood.       Objective:   Physical Exam  Constitutional: Vital signs are normal. She appears well-developed and well-nourished. She is cooperative.  Non-toxic appearance. She does not appear ill. No distress.  HENT:  Head: Normocephalic.  Right Ear: Hearing, tympanic membrane, external ear and ear canal normal.  Left Ear: Hearing, tympanic membrane, external ear and ear canal normal.  Nose: Nose normal.  Eyes: Conjunctivae, EOM and lids are normal. Pupils are equal, round, and reactive to light. Lids are everted and swept, no foreign bodies found.  Neck: Trachea normal and normal range of motion. Neck supple.  Carotid bruit is not present. No thyroid mass and no thyromegaly present.  Cardiovascular: Normal rate, regular rhythm, S1 normal, S2 normal, normal heart sounds and intact distal pulses. Exam reveals no gallop.  No murmur heard. Pulmonary/Chest: Effort normal and breath sounds normal. No respiratory distress. She has no wheezes. She has no rhonchi. She has no rales.  Abdominal: Soft. Normal appearance and bowel sounds are normal. She exhibits no distension, no fluid wave, no abdominal bruit and no mass. There is no  hepatosplenomegaly. There is no tenderness. There is no rebound, no guarding and no CVA tenderness. No hernia.  Lymphadenopathy:    She has no cervical adenopathy.    She has no axillary adenopathy.  Neurological: She is alert. She has normal strength. No cranial nerve deficit or sensory deficit.  Skin: Skin is warm, dry and intact. No rash noted.  Psychiatric: Her speech is normal and behavior is normal. Judgment normal. Her mood appears not anxious. Cognition and memory are normal. She does not exhibit a depressed mood.          Assessment & Plan:  The patient's preventative maintenance and recommended screening tests for an annual wellness exam were reviewed in full today. Brought up to date unless services declined.  Counselled on the importance of diet, exercise, and its role in overall health and mortality. The patient's FH and SH was reviewed, including their home life, tobacco status, and drug and alcohol status.   Mammo: 09/2016 , repeat q2 years  DEXA: 09/2012 Mild osteopenia, spine density somewhat improved since last check! Continue weight bearing exercise and Ca and vit D. Recheck in 5 years 2019.     DVE, PAP: DVE prn, no pap indicated, no family history uterine or ovarian cancer, no vag symptoms. Colon: polyp 2011... Repeat 10 years  Vaccines: uptodate except shingles and tdap.

## 2017-10-11 NOTE — Assessment & Plan Note (Signed)
Good control on supplement. 

## 2017-10-11 NOTE — Assessment & Plan Note (Signed)
Well controlled. Continue current medication.  

## 2017-10-11 NOTE — Assessment & Plan Note (Signed)
Well controlled. Continue current medication. Encouraged exercise, weight loss, healthy eating habits.  

## 2017-10-11 NOTE — Assessment & Plan Note (Signed)
Good control at home on current med. She will follow. Work on low salt and weight loss.

## 2017-10-24 ENCOUNTER — Other Ambulatory Visit: Payer: Self-pay | Admitting: Family Medicine

## 2017-11-07 DIAGNOSIS — M85852 Other specified disorders of bone density and structure, left thigh: Secondary | ICD-10-CM | POA: Diagnosis not present

## 2017-11-07 DIAGNOSIS — Z780 Asymptomatic menopausal state: Secondary | ICD-10-CM | POA: Diagnosis not present

## 2017-11-07 DIAGNOSIS — M8588 Other specified disorders of bone density and structure, other site: Secondary | ICD-10-CM | POA: Diagnosis not present

## 2017-11-07 LAB — HM DEXA SCAN: HM DEXA SCAN: NORMAL

## 2017-11-11 ENCOUNTER — Encounter: Payer: Self-pay | Admitting: Family Medicine

## 2017-12-12 ENCOUNTER — Other Ambulatory Visit: Payer: Self-pay | Admitting: Family Medicine

## 2018-05-16 ENCOUNTER — Other Ambulatory Visit: Payer: Self-pay | Admitting: Family Medicine

## 2018-06-26 DIAGNOSIS — R69 Illness, unspecified: Secondary | ICD-10-CM | POA: Diagnosis not present

## 2018-06-29 ENCOUNTER — Ambulatory Visit: Payer: Medicare HMO

## 2018-07-09 ENCOUNTER — Other Ambulatory Visit: Payer: Self-pay | Admitting: Family Medicine

## 2018-09-22 DIAGNOSIS — Z1231 Encounter for screening mammogram for malignant neoplasm of breast: Secondary | ICD-10-CM | POA: Diagnosis not present

## 2018-09-22 LAB — HM MAMMOGRAPHY

## 2018-09-25 ENCOUNTER — Encounter: Payer: Self-pay | Admitting: Family Medicine

## 2018-09-29 DIAGNOSIS — H25042 Posterior subcapsular polar age-related cataract, left eye: Secondary | ICD-10-CM | POA: Diagnosis not present

## 2018-09-29 DIAGNOSIS — H353131 Nonexudative age-related macular degeneration, bilateral, early dry stage: Secondary | ICD-10-CM | POA: Diagnosis not present

## 2018-09-29 DIAGNOSIS — H524 Presbyopia: Secondary | ICD-10-CM | POA: Diagnosis not present

## 2018-09-29 DIAGNOSIS — H5231 Anisometropia: Secondary | ICD-10-CM | POA: Diagnosis not present

## 2018-09-29 DIAGNOSIS — H2513 Age-related nuclear cataract, bilateral: Secondary | ICD-10-CM | POA: Diagnosis not present

## 2018-10-09 ENCOUNTER — Other Ambulatory Visit: Payer: Self-pay | Admitting: Family Medicine

## 2018-10-10 ENCOUNTER — Other Ambulatory Visit: Payer: Self-pay

## 2018-10-13 ENCOUNTER — Telehealth: Payer: Self-pay | Admitting: Family Medicine

## 2018-10-13 DIAGNOSIS — E782 Mixed hyperlipidemia: Secondary | ICD-10-CM

## 2018-10-13 DIAGNOSIS — E559 Vitamin D deficiency, unspecified: Secondary | ICD-10-CM

## 2018-10-13 NOTE — Telephone Encounter (Signed)
-----   Message from Lendon Collar, RT sent at 10/09/2018  9:03 AM EST ----- Regarding: Lab orders for Monday 10/16/18 Please enter CPE lab orders for 10/16/18. Thanks!

## 2018-10-16 ENCOUNTER — Other Ambulatory Visit (INDEPENDENT_AMBULATORY_CARE_PROVIDER_SITE_OTHER): Payer: Medicare HMO

## 2018-10-16 DIAGNOSIS — E559 Vitamin D deficiency, unspecified: Secondary | ICD-10-CM | POA: Diagnosis not present

## 2018-10-16 DIAGNOSIS — E782 Mixed hyperlipidemia: Secondary | ICD-10-CM | POA: Diagnosis not present

## 2018-10-16 LAB — LIPID PANEL
Cholesterol: 186 mg/dL (ref 0–200)
HDL: 52.4 mg/dL (ref >39.00)
NonHDL: 133.24
Total CHOL/HDL Ratio: 4
Triglycerides: 211 mg/dL — ABNORMAL HIGH (ref 0.0–149.0)
VLDL: 42.2 mg/dL — ABNORMAL HIGH (ref 0.0–40.0)

## 2018-10-16 LAB — COMPREHENSIVE METABOLIC PANEL
ALT: 20 U/L (ref 0–35)
AST: 20 U/L (ref 0–37)
Albumin: 4.4 g/dL (ref 3.5–5.2)
Alkaline Phosphatase: 28 U/L — ABNORMAL LOW (ref 39–117)
BILIRUBIN TOTAL: 1 mg/dL (ref 0.2–1.2)
BUN: 12 mg/dL (ref 6–23)
CO2: 31 mEq/L (ref 19–32)
CREATININE: 0.92 mg/dL (ref 0.40–1.20)
Calcium: 9.9 mg/dL (ref 8.4–10.5)
Chloride: 99 mEq/L (ref 96–112)
GFR: 60.13 mL/min (ref >60.00)
Glucose, Bld: 107 mg/dL — ABNORMAL HIGH (ref 70–99)
Potassium: 3.8 mEq/L (ref 3.5–5.1)
SODIUM: 140 meq/L (ref 135–145)
Total Protein: 7.6 g/dL (ref 6.0–8.3)

## 2018-10-16 LAB — VITAMIN D 25 HYDROXY (VIT D DEFICIENCY, FRACTURES): VITD: 39.58 ng/mL (ref 30.00–100.00)

## 2018-10-16 LAB — LDL CHOLESTEROL, DIRECT: Direct LDL: 100 mg/dL

## 2018-10-20 ENCOUNTER — Encounter: Payer: Self-pay | Admitting: Family Medicine

## 2018-10-20 ENCOUNTER — Ambulatory Visit (INDEPENDENT_AMBULATORY_CARE_PROVIDER_SITE_OTHER): Payer: Medicare HMO | Admitting: Family Medicine

## 2018-10-20 VITALS — BP 130/80 | HR 88 | Temp 98.1°F | Ht 63.5 in | Wt 167.2 lb

## 2018-10-20 DIAGNOSIS — R5383 Other fatigue: Secondary | ICD-10-CM

## 2018-10-20 DIAGNOSIS — E782 Mixed hyperlipidemia: Secondary | ICD-10-CM | POA: Diagnosis not present

## 2018-10-20 DIAGNOSIS — E559 Vitamin D deficiency, unspecified: Secondary | ICD-10-CM | POA: Diagnosis not present

## 2018-10-20 DIAGNOSIS — I10 Essential (primary) hypertension: Secondary | ICD-10-CM

## 2018-10-20 DIAGNOSIS — Z0000 Encounter for general adult medical examination without abnormal findings: Secondary | ICD-10-CM | POA: Diagnosis not present

## 2018-10-20 DIAGNOSIS — F411 Generalized anxiety disorder: Secondary | ICD-10-CM

## 2018-10-20 DIAGNOSIS — R7303 Prediabetes: Secondary | ICD-10-CM | POA: Diagnosis not present

## 2018-10-20 DIAGNOSIS — R69 Illness, unspecified: Secondary | ICD-10-CM | POA: Diagnosis not present

## 2018-10-20 LAB — CBC WITH DIFFERENTIAL/PLATELET
Basophils Absolute: 0 10*3/uL (ref 0.0–0.1)
Basophils Relative: 0.6 % (ref 0.0–3.0)
Eosinophils Absolute: 0 10*3/uL (ref 0.0–0.7)
Eosinophils Relative: 0.4 % (ref 0.0–5.0)
HCT: 42 % (ref 36.0–46.0)
HEMOGLOBIN: 14 g/dL (ref 12.0–15.0)
LYMPHS PCT: 17 % (ref 12.0–46.0)
Lymphs Abs: 1.3 10*3/uL (ref 0.7–4.0)
MCHC: 33.4 g/dL (ref 30.0–36.0)
MCV: 80.6 fl (ref 78.0–100.0)
Monocytes Absolute: 0.7 10*3/uL (ref 0.1–1.0)
Monocytes Relative: 9.5 % (ref 3.0–12.0)
Neutro Abs: 5.3 10*3/uL (ref 1.4–7.7)
Neutrophils Relative %: 72.5 % (ref 43.0–77.0)
Platelets: 256 10*3/uL (ref 150.0–400.0)
RBC: 5.21 Mil/uL — ABNORMAL HIGH (ref 3.87–5.11)
RDW: 14.7 % (ref 11.5–15.5)
WBC: 7.4 10*3/uL (ref 4.0–10.5)

## 2018-10-20 LAB — HEMOGLOBIN A1C: Hgb A1c MFr Bld: 6.3 % (ref 4.6–6.5)

## 2018-10-20 NOTE — Assessment & Plan Note (Signed)
Resolved

## 2018-10-20 NOTE — Patient Instructions (Addendum)
Stop at lab on way out for cbc.  Work on low carbohydrate and low cholesterol diet.

## 2018-10-20 NOTE — Assessment & Plan Note (Signed)
LDL at goal on statin. 

## 2018-10-20 NOTE — Assessment & Plan Note (Signed)
Well controlled. Continue current medication.  

## 2018-10-20 NOTE — Assessment & Plan Note (Signed)
Stable control on sertraline  

## 2018-10-20 NOTE — Progress Notes (Signed)
Subjective:    Patient ID: Tammy Schmidt, female    DOB: 02/10/1946, 73 y.o.   MRN: [id number]  HPI  The patient presents for annual medicare wellness, complete physical and review of chronic health problems. He/She also has the following acute concerns today: none  Hypertension:   Good control on lisinopril/HCTZ.   BP Readings from Last 3 Encounters:  10/20/18 130/80  10/11/17 (!) 158/70  10/04/17 (!) 177/78  Using medication without problems or lightheadedness: none Chest pain with exertion:none Edema: none Short of breath:none Average home BPs: good Other issues:   prediabetes stable  Elevated Cholesterol:  LDL < 100 at goal  atorvastatin Lab Results  Component Value Date   CHOL 186 10/16/2018   HDL 52.40 10/16/2018   LDLCALC 93 10/04/2017   LDLDIRECT 100.0 10/16/2018   TRIG 211.0 (H) 10/16/2018   CHOLHDL 4 10/16/2018  Using medications without problems:none Muscle aches: none Diet compliance: good Exercise: walking daily, going to gym 2 times a wek Other complaints:  GAD, well controlled on sertraline 100 mg daily.   Hearing Screening   Method: Audiometry   125Hz  250Hz  500Hz  1000Hz  2000Hz  3000Hz  4000Hz  6000Hz  8000Hz   Right ear:   20 20 20  20     Left ear:   20 20 20  20     Vision Screening Comments: Wears Glasses-Eye exam with [facility] 09/2018  Upcoming left cataract surgery.   Advance directives and end of life planning reviewed in detail with patient and documented in EMR. Patient given handout on advance care directives if needed. HCPOA and living will updated if needed.  Fall Risk  10/20/2018 10/04/2017 09/30/2016 08/09/2014 07/31/2013  Falls in the past year? 0 No No No No     Office Visit from 10/20/2018 in [place] at [facility] Total Score  0     Social History /Family History/Past Medical History reviewed in detail and updated in EMR if needed. Blood pressure 130/80, pulse 88, temperature 98.1 F (36.7 C), temperature  source Oral, height 5' 3.5" (1.613 m), weight 167 lb 4 oz (75.9 kg).  Review of Systems  Constitutional: Positive for fatigue. Negative for fever.  HENT: Negative for congestion.   Eyes: Negative for pain.  Respiratory: Negative for cough and shortness of breath.   Cardiovascular: Negative for chest pain, palpitations and leg swelling.  Gastrointestinal: Negative for abdominal pain.  Genitourinary: Negative for dysuria and vaginal bleeding.  Musculoskeletal: Negative for back pain.  Neurological: Negative for syncope, light-headedness and headaches.  Psychiatric/Behavioral: Negative for dysphoric mood.       Objective:   Physical Exam Constitutional:      General: She is not in acute distress.    Appearance: Normal appearance. She is well-developed. She is not ill-appearing or toxic-appearing.  HENT:     Head: Normocephalic.     Right Ear: Hearing, tympanic membrane, ear canal and external ear normal.     Left Ear: Hearing, tympanic membrane, ear canal and external ear normal.     Nose: Nose normal.  Eyes:     General: Lids are normal. Lids are everted, no foreign bodies appreciated.     Conjunctiva/sclera: Conjunctivae normal.     Pupils: Pupils are equal, round, and reactive to light.  Neck:     Musculoskeletal: Normal range of motion and neck supple.     Thyroid: No thyroid mass or thyromegaly.     Vascular: No carotid bruit.     Trachea: Trachea normal.  Cardiovascular:  Rate and Rhythm: Normal rate and regular rhythm.     Heart sounds: Normal heart sounds, S1 normal and S2 normal. No murmur. No gallop.   Pulmonary:     Effort: Pulmonary effort is normal. No respiratory distress.     Breath sounds: Normal breath sounds. No wheezing, rhonchi or rales.  Abdominal:     General: Bowel sounds are normal. There is no distension or abdominal bruit.     Palpations: Abdomen is soft. There is no fluid wave or mass.     Tenderness: There is no abdominal tenderness. There is no  guarding or rebound.     Hernia: No hernia is present.  Lymphadenopathy:     Cervical: No cervical adenopathy.  Skin:    General: Skin is warm and dry.     Findings: No rash.  Neurological:     Mental Status: She is alert.     Cranial Nerves: No cranial nerve deficit.     Sensory: No sensory deficit.  Psychiatric:        Mood and Affect: Mood is not anxious or depressed.        Speech: Speech normal.        Behavior: Behavior normal. Behavior is cooperative.        Judgment: Judgment normal.           Assessment & Plan:  The patient's preventative maintenance and recommended screening tests for an annual wellness exam were reviewed in full today. Brought up to date unless services declined.  Counselled on the importance of diet, exercise, and its role in overall health and mortality. The patient's FH and SH was reviewed, including their home life, tobacco status, and drug and alcohol status.   Mammo: 09/2018 , repeat q2 years  DEXA:10/2017 improved,was osteopenia in [address], now normal DVE, PAP: DVE prn, no pap indicated, no family history uterine or ovarian cancer, no vag symptoms. Colon: polyp 08/2010... Repeat 10 years  Vaccines: uptodate. Former smoker remote > 40 years ago

## 2018-10-20 NOTE — Assessment & Plan Note (Signed)
Decrease carbs and increase exercise.

## 2018-10-30 DIAGNOSIS — H2513 Age-related nuclear cataract, bilateral: Secondary | ICD-10-CM | POA: Diagnosis not present

## 2018-10-30 DIAGNOSIS — H2511 Age-related nuclear cataract, right eye: Secondary | ICD-10-CM | POA: Diagnosis not present

## 2018-10-30 DIAGNOSIS — H25013 Cortical age-related cataract, bilateral: Secondary | ICD-10-CM | POA: Diagnosis not present

## 2018-10-30 DIAGNOSIS — I10 Essential (primary) hypertension: Secondary | ICD-10-CM | POA: Diagnosis not present

## 2018-10-30 DIAGNOSIS — H25043 Posterior subcapsular polar age-related cataract, bilateral: Secondary | ICD-10-CM | POA: Diagnosis not present

## 2018-11-21 DIAGNOSIS — Z961 Presence of intraocular lens: Secondary | ICD-10-CM | POA: Diagnosis not present

## 2018-11-21 DIAGNOSIS — H2511 Age-related nuclear cataract, right eye: Secondary | ICD-10-CM | POA: Diagnosis not present

## 2018-11-21 DIAGNOSIS — H25811 Combined forms of age-related cataract, right eye: Secondary | ICD-10-CM | POA: Diagnosis not present

## 2018-11-22 DIAGNOSIS — H2512 Age-related nuclear cataract, left eye: Secondary | ICD-10-CM | POA: Diagnosis not present

## 2018-11-22 DIAGNOSIS — H25042 Posterior subcapsular polar age-related cataract, left eye: Secondary | ICD-10-CM | POA: Diagnosis not present

## 2018-11-22 DIAGNOSIS — H25012 Cortical age-related cataract, left eye: Secondary | ICD-10-CM | POA: Diagnosis not present

## 2018-11-30 ENCOUNTER — Other Ambulatory Visit: Payer: Self-pay | Admitting: Family Medicine

## 2018-12-05 DIAGNOSIS — H2512 Age-related nuclear cataract, left eye: Secondary | ICD-10-CM | POA: Diagnosis not present

## 2018-12-05 DIAGNOSIS — H2511 Age-related nuclear cataract, right eye: Secondary | ICD-10-CM | POA: Diagnosis not present

## 2018-12-05 DIAGNOSIS — Z961 Presence of intraocular lens: Secondary | ICD-10-CM | POA: Diagnosis not present

## 2018-12-05 DIAGNOSIS — H25812 Combined forms of age-related cataract, left eye: Secondary | ICD-10-CM | POA: Diagnosis not present

## 2019-01-05 ENCOUNTER — Other Ambulatory Visit: Payer: Self-pay | Admitting: Family Medicine

## 2019-02-26 DIAGNOSIS — Q123 Congenital aphakia: Secondary | ICD-10-CM | POA: Diagnosis not present

## 2019-05-31 ENCOUNTER — Ambulatory Visit (INDEPENDENT_AMBULATORY_CARE_PROVIDER_SITE_OTHER): Payer: Medicare HMO

## 2019-05-31 DIAGNOSIS — Z23 Encounter for immunization: Secondary | ICD-10-CM | POA: Diagnosis not present

## 2019-06-25 ENCOUNTER — Other Ambulatory Visit: Payer: Self-pay | Admitting: Family Medicine

## 2019-10-18 ENCOUNTER — Telehealth: Payer: Self-pay | Admitting: Family Medicine

## 2019-10-18 DIAGNOSIS — E559 Vitamin D deficiency, unspecified: Secondary | ICD-10-CM

## 2019-10-18 DIAGNOSIS — E782 Mixed hyperlipidemia: Secondary | ICD-10-CM

## 2019-10-18 DIAGNOSIS — R7303 Prediabetes: Secondary | ICD-10-CM

## 2019-10-18 NOTE — Telephone Encounter (Signed)
-----   Message from Ellamae Sia sent at 10/05/2019  2:56 PM EST ----- Regarding: Lab orders for Friday, 1.29.21 Patient is scheduled for CPX labs, please order future labs, Thanks , Karna Christmas

## 2019-10-19 ENCOUNTER — Other Ambulatory Visit: Payer: Self-pay

## 2019-10-19 ENCOUNTER — Other Ambulatory Visit (INDEPENDENT_AMBULATORY_CARE_PROVIDER_SITE_OTHER): Payer: Medicare HMO

## 2019-10-19 DIAGNOSIS — E782 Mixed hyperlipidemia: Secondary | ICD-10-CM | POA: Diagnosis not present

## 2019-10-19 DIAGNOSIS — E559 Vitamin D deficiency, unspecified: Secondary | ICD-10-CM | POA: Diagnosis not present

## 2019-10-19 DIAGNOSIS — R7303 Prediabetes: Secondary | ICD-10-CM | POA: Diagnosis not present

## 2019-10-19 LAB — COMPREHENSIVE METABOLIC PANEL
ALT: 22 U/L (ref 0–35)
AST: 24 U/L (ref 0–37)
Albumin: 4.2 g/dL (ref 3.5–5.2)
Alkaline Phosphatase: 24 U/L — ABNORMAL LOW (ref 39–117)
BUN: 14 mg/dL (ref 6–23)
CO2: 34 mEq/L — ABNORMAL HIGH (ref 19–32)
Calcium: 9.7 mg/dL (ref 8.4–10.5)
Chloride: 98 mEq/L (ref 96–112)
Creatinine, Ser: 0.87 mg/dL (ref 0.40–1.20)
GFR: 63.95 mL/min (ref >60.00)
Glucose, Bld: 98 mg/dL (ref 70–99)
Potassium: 3.5 mEq/L (ref 3.5–5.1)
Sodium: 138 mEq/L (ref 135–145)
Total Bilirubin: 1 mg/dL (ref 0.2–1.2)
Total Protein: 7.3 g/dL (ref 6.0–8.3)

## 2019-10-19 LAB — LIPID PANEL
Cholesterol: 180 mg/dL (ref 0–200)
HDL: 48.6 mg/dL (ref >39.00)
LDL Cholesterol: 93 mg/dL (ref 0–99)
NonHDL: 131.29
Total CHOL/HDL Ratio: 4
Triglycerides: 193 mg/dL — ABNORMAL HIGH (ref 0.0–149.0)
VLDL: 38.6 mg/dL (ref 0.0–40.0)

## 2019-10-19 LAB — VITAMIN D 25 HYDROXY (VIT D DEFICIENCY, FRACTURES): VITD: 36.27 ng/mL (ref 30.00–100.00)

## 2019-10-19 LAB — HEMOGLOBIN A1C: Hgb A1c MFr Bld: 6.3 % (ref 4.6–6.5)

## 2019-10-19 NOTE — Progress Notes (Signed)
No critical labs need to be addressed urgently. We will discuss labs in detail at upcoming office visit.   

## 2019-10-26 ENCOUNTER — Other Ambulatory Visit: Payer: Self-pay

## 2019-10-26 ENCOUNTER — Ambulatory Visit (INDEPENDENT_AMBULATORY_CARE_PROVIDER_SITE_OTHER): Payer: Medicare HMO | Admitting: Family Medicine

## 2019-10-26 ENCOUNTER — Encounter: Payer: Self-pay | Admitting: Family Medicine

## 2019-10-26 VITALS — BP 142/86 | HR 89 | Temp 98.2°F | Ht 63.5 in | Wt 164.0 lb

## 2019-10-26 DIAGNOSIS — R69 Illness, unspecified: Secondary | ICD-10-CM | POA: Diagnosis not present

## 2019-10-26 DIAGNOSIS — E559 Vitamin D deficiency, unspecified: Secondary | ICD-10-CM | POA: Diagnosis not present

## 2019-10-26 DIAGNOSIS — F411 Generalized anxiety disorder: Secondary | ICD-10-CM

## 2019-10-26 DIAGNOSIS — R7303 Prediabetes: Secondary | ICD-10-CM | POA: Diagnosis not present

## 2019-10-26 DIAGNOSIS — Z0000 Encounter for general adult medical examination without abnormal findings: Secondary | ICD-10-CM | POA: Diagnosis not present

## 2019-10-26 DIAGNOSIS — E782 Mixed hyperlipidemia: Secondary | ICD-10-CM | POA: Diagnosis not present

## 2019-10-26 DIAGNOSIS — I10 Essential (primary) hypertension: Secondary | ICD-10-CM | POA: Diagnosis not present

## 2019-10-26 DIAGNOSIS — M8580 Other specified disorders of bone density and structure, unspecified site: Secondary | ICD-10-CM | POA: Diagnosis not present

## 2019-10-26 NOTE — Assessment & Plan Note (Signed)
Well controlled. Continue current medication. ON statin. 

## 2019-10-26 NOTE — Progress Notes (Signed)
Chief Complaint  Patient presents with  . Medicare Wellness    Vision--[facility] 2020    History of Present Illness: HPI The patient presents for annual medicare wellness, complete physical and review of chronic health problems. He/She also has the following acute concerns today:  I have personally reviewed the Medicare Annual Wellness questionnaire and have noted 1. The patient's medical and social history 2. Their use of alcohol, tobacco or illicit drugs 3. Their current medications and supplements 4. The patient's functional ability including ADL's, fall risks, home safety risks and hearing or visual             impairment. 5. Diet and physical activities 6. Evidence for depression or mood disorders 7.         Updated provider list Cognitive evaluation was performed and recorded on pt medicare questionnaire form. The patients weight, height, BMI and visual acuity have been recorded in the chart  I have made referrals, counseling and provided education to the patient based review of the above and I have provided the pt with a written personalized care plan for preventive services.   Documentation of this information was scanned into the electronic record under the media tab.   Advance directives and end of life planning reviewed in detail with patient and documented in EMR. Patient given handout on advance care directives if needed. HCPOA and living will updated if needed.    Office Visit from 10/26/2019 in [place] at [facility]  PHQ-2 Total Score  0      Hearing Screening   125Hz  250Hz  500Hz  1000Hz  2000Hz  3000Hz  4000Hz  6000Hz  8000Hz   Right ear:   20 20 20  20     Left ear:   20 20 20  20     Vision Screening Comments: [facility] 12/2018  Fall Risk  10/26/2019 10/20/2018 10/04/2017 09/30/2016 08/09/2014  Falls in the past year? 0 0 No No No  Number falls in past yr: 0 - - - -  Injury with Fall? 0 - - - -     Elevated Cholesterol: LDL at goal on statin Lab  Results  Component Value Date   CHOL 180 10/19/2019   HDL 48.60 10/19/2019   LDLCALC 93 10/19/2019   LDLDIRECT 100.0 10/16/2018   TRIG 193.0 (H) 10/19/2019   CHOLHDL 4 10/19/2019  Using medications without problems:none Muscle aches: none Diet compliance: moderate Exercise: less than prepandemic Other complaints: Prediabetes  Lab Results  Component Value Date   HGBA1C 6.3 10/19/2019    Hypertension:   White coat HTN component.  On lisinopril HCTZ  Using medication without problems or lightheadedness:  None Chest pain with exertion: none Edema:none Short of breath:none Average home BPs: Other issues:  GAD stable control on SSRI   This visit occurred during the SARS-CoV-2 public health emergency.  Safety protocols were in place, including screening questions prior to the visit, additional usage of staff PPE, and extensive cleaning of exam room while observing appropriate contact time as indicated for disinfecting solutions.   COVID 19 screen:  No recent travel or known exposure to COVID19 The patient denies respiratory symptoms of COVID 19 at this time. The importance of social distancing was discussed today.     Review of Systems  Constitutional: Negative for chills and fever.  HENT: Negative for congestion and ear pain.   Eyes: Negative for pain and redness.  Respiratory: Negative for cough and shortness of breath.   Cardiovascular: Negative for chest pain, palpitations and leg swelling.  Gastrointestinal: Negative for abdominal pain, blood in stool, constipation, diarrhea, nausea and vomiting.  Genitourinary: Negative for dysuria.  Musculoskeletal: Negative for falls and myalgias.  Skin: Negative for rash.  Neurological: Negative for dizziness.  Psychiatric/Behavioral: Negative for depression. The patient is not nervous/anxious.       Past Medical History:  Diagnosis Date  . Anxiety state, unspecified   . Disorder of bone and cartilage, unspecified   .  Dyspepsia and other specified disorders of function of stomach   . Elevated blood pressure reading without diagnosis of hypertension   . Encounter for long-term (current) use of other medications   . Other and unspecified hyperlipidemia   . Other specified cardiac dysrhythmias(427.89)     reports that she has quit smoking. She has never used smokeless tobacco. She reports current alcohol use. She reports that she does not use drugs.   Current Outpatient Medications:  .  aspirin 81 MG tablet, Take 81 mg by mouth daily.  , Disp: , Rfl:  .  atorvastatin (LIPITOR) 40 MG tablet, Take 1 tablet by mouth once daily, Disp: 90 tablet, Rfl: 2 .  calcium-vitamin D (OSCAL WITH D) 500-200 MG-UNIT per tablet, Take 1 tablet by mouth daily.  , Disp: , Rfl:  .  Cholecalciferol (VITAMIN D3) 400 units CAPS, Take 1 capsule by mouth daily. , Disp: , Rfl:  .  Cyanocobalamin (VITAMIN B 12 PO), Take by mouth daily., Disp: , Rfl:  .  lisinopril-hydrochlorothiazide (ZESTORETIC) 20-25 MG tablet, Take 1 tablet by mouth once daily, Disp: 90 tablet, Rfl: 1 .  Multiple Vitamin (MULTIVITAMIN) tablet, Take 1 tablet by mouth daily., Disp: , Rfl:  .  sertraline (ZOLOFT) 100 MG tablet, Take 1 tablet by mouth once daily, Disp: 90 tablet, Rfl: 0   Observations/Objective: Blood pressure (!) 142/86, pulse 89, temperature 98.2 F (36.8 C), temperature source Temporal, height 5' 3.5" (1.613 m), weight 164 lb (74.4 kg), SpO2 97 %.  Physical Exam Constitutional:      General: She is not in acute distress.    Appearance: Normal appearance. She is well-developed. She is not ill-appearing or toxic-appearing.  HENT:     Head: Normocephalic.     Right Ear: Hearing, tympanic membrane, ear canal and external ear normal.     Left Ear: Hearing, tympanic membrane, ear canal and external ear normal.     Nose: Nose normal.  Eyes:     General: Lids are normal. Lids are everted, no foreign bodies appreciated.     Conjunctiva/sclera:  Conjunctivae normal.     Pupils: Pupils are equal, round, and reactive to light.  Neck:     Thyroid: No thyroid mass or thyromegaly.     Vascular: No carotid bruit.     Trachea: Trachea normal.  Cardiovascular:     Rate and Rhythm: Normal rate and regular rhythm.     Heart sounds: Normal heart sounds, S1 normal and S2 normal. No murmur. No gallop.   Pulmonary:     Effort: Pulmonary effort is normal. No respiratory distress.     Breath sounds: Normal breath sounds. No wheezing, rhonchi or rales.  Abdominal:     General: Bowel sounds are normal. There is no distension or abdominal bruit.     Palpations: Abdomen is soft. There is no fluid wave or mass.     Tenderness: There is no abdominal tenderness. There is no guarding or rebound.     Hernia: No hernia is present.  Musculoskeletal:     Cervical  back: Normal range of motion and neck supple.  Lymphadenopathy:     Cervical: No cervical adenopathy.  Skin:    General: Skin is warm and dry.     Findings: No rash.  Neurological:     Mental Status: She is alert.     Cranial Nerves: No cranial nerve deficit.     Sensory: No sensory deficit.  Psychiatric:        Mood and Affect: Mood is not anxious or depressed.        Speech: Speech normal.        Behavior: Behavior normal. Behavior is cooperative.        Judgment: Judgment normal.      Assessment and Plan   The patient's preventative maintenance and recommended screening tests for an annual wellness exam were reviewed in full today. Brought up to date unless services declined.  Counselled on the importance of diet, exercise, and its role in overall health and mortality. The patient's FH and SH was reviewed, including their home life, tobacco status, and drug and alcohol status.   Mammo: 09/2018, repeat q2 years  DEXA:10/2017 improved,was osteopenia in 2014, now normal DVE, PAP: DVEprn, no pap indicated, no family history uterine or ovarian cancer, no vag symptoms. Colon: polyp  08/2010... Repeat 10 years , Dr. Tiffany Kocher. Vaccines: uptodate. Former smoker remote > 40 years ago  White coat syndrome with diagnosis of hypertension Well controlled. Continue current medication.   Vitamin D deficiency  Improved on supplement  Generalized anxiety disorder  Stable control on SSRI.  Mixed hyperlipidemia Well controlled. Continue current medication. ON statin.  Osteopenia Resolved and normal on last DEXA 2019.    Eliezer Lofts, MD

## 2019-10-26 NOTE — Assessment & Plan Note (Signed)
Improved on supplement

## 2019-10-26 NOTE — Assessment & Plan Note (Signed)
Resolved and normal on last DEXA 2019.

## 2019-10-26 NOTE — Patient Instructions (Addendum)
Work on increasing exercise, weight loss, healthy eating habits. Call Dr. Vira Agar to set up colonoscopy after 08/2020.

## 2019-10-26 NOTE — Assessment & Plan Note (Signed)
Stable control on SSRI.

## 2019-10-26 NOTE — Assessment & Plan Note (Signed)
Well controlled. Continue current medication.  

## 2019-10-29 ENCOUNTER — Other Ambulatory Visit: Payer: Self-pay | Admitting: Family Medicine

## 2019-11-04 ENCOUNTER — Ambulatory Visit: Payer: Medicare HMO

## 2020-03-14 DIAGNOSIS — Z9841 Cataract extraction status, right eye: Secondary | ICD-10-CM | POA: Diagnosis not present

## 2020-03-14 DIAGNOSIS — H5212 Myopia, left eye: Secondary | ICD-10-CM | POA: Diagnosis not present

## 2020-03-14 DIAGNOSIS — Z9842 Cataract extraction status, left eye: Secondary | ICD-10-CM | POA: Diagnosis not present

## 2020-06-11 ENCOUNTER — Other Ambulatory Visit: Payer: Self-pay | Admitting: Family Medicine

## 2020-06-12 ENCOUNTER — Ambulatory Visit (INDEPENDENT_AMBULATORY_CARE_PROVIDER_SITE_OTHER): Payer: Medicare HMO

## 2020-06-12 ENCOUNTER — Other Ambulatory Visit: Payer: Self-pay

## 2020-06-12 DIAGNOSIS — Z23 Encounter for immunization: Secondary | ICD-10-CM | POA: Diagnosis not present

## 2020-07-02 LAB — FECAL OCCULT BLOOD, IMMUNOCHEMICAL: IFOBT: NEGATIVE

## 2020-07-14 DIAGNOSIS — Z1231 Encounter for screening mammogram for malignant neoplasm of breast: Secondary | ICD-10-CM | POA: Diagnosis not present

## 2020-07-20 ENCOUNTER — Other Ambulatory Visit: Payer: Self-pay | Admitting: Family Medicine

## 2020-07-22 ENCOUNTER — Encounter: Payer: Self-pay | Admitting: Family Medicine

## 2020-07-25 ENCOUNTER — Other Ambulatory Visit: Payer: Self-pay | Admitting: Family Medicine

## 2020-07-25 DIAGNOSIS — R928 Other abnormal and inconclusive findings on diagnostic imaging of breast: Secondary | ICD-10-CM

## 2020-08-04 ENCOUNTER — Ambulatory Visit
Admission: RE | Admit: 2020-08-04 | Discharge: 2020-08-04 | Disposition: A | Discharge status: Home / Self Care | Payer: Medicare HMO | Source: Ambulatory Visit | Attending: Family Medicine | Admitting: Family Medicine

## 2020-08-04 ENCOUNTER — Other Ambulatory Visit: Payer: Self-pay

## 2020-08-04 ENCOUNTER — Other Ambulatory Visit: Payer: Self-pay | Admitting: Family Medicine

## 2020-08-04 DIAGNOSIS — N6489 Other specified disorders of breast: Secondary | ICD-10-CM | POA: Diagnosis not present

## 2020-08-04 DIAGNOSIS — R928 Other abnormal and inconclusive findings on diagnostic imaging of breast: Secondary | ICD-10-CM

## 2020-08-05 ENCOUNTER — Other Ambulatory Visit: Payer: Self-pay | Admitting: Family Medicine

## 2020-08-05 ENCOUNTER — Ambulatory Visit
Admission: RE | Admit: 2020-08-05 | Discharge: 2020-08-05 | Disposition: A | Discharge status: Home / Self Care | Payer: Medicare HMO | Source: Ambulatory Visit | Attending: Family Medicine | Admitting: Family Medicine

## 2020-08-05 DIAGNOSIS — C50312 Malignant neoplasm of lower-inner quadrant of left female breast: Secondary | ICD-10-CM | POA: Diagnosis not present

## 2020-08-05 DIAGNOSIS — R928 Other abnormal and inconclusive findings on diagnostic imaging of breast: Secondary | ICD-10-CM | POA: Diagnosis present

## 2020-08-05 DIAGNOSIS — N6325 Unspecified lump in the left breast, overlapping quadrants: Secondary | ICD-10-CM | POA: Diagnosis not present

## 2020-08-05 HISTORY — PX: BREAST BIOPSY: SHX20

## 2020-08-11 LAB — SURGICAL PATHOLOGY

## 2020-09-08 ENCOUNTER — Encounter: Payer: Self-pay | Admitting: Family Medicine

## 2020-11-26 ENCOUNTER — Telehealth: Payer: Self-pay | Admitting: Family Medicine

## 2020-11-26 NOTE — Telephone Encounter (Signed)
LVM for pt to rtn my call to schedule AWV, LABS and CPE

## 2020-12-07 IMAGING — MG MM BREAST LOCALIZATION CLIP
4 series · 4 of 12 positions shown · non-contrast
Comparison: Previous exam(s).

CLINICAL DATA: Post biopsy mammogram of the left breast for clip
placement.

EXAM:
DIAGNOSTIC LEFT MAMMOGRAM POST ULTRASOUND BIOPSY

[L ML synth-2D]
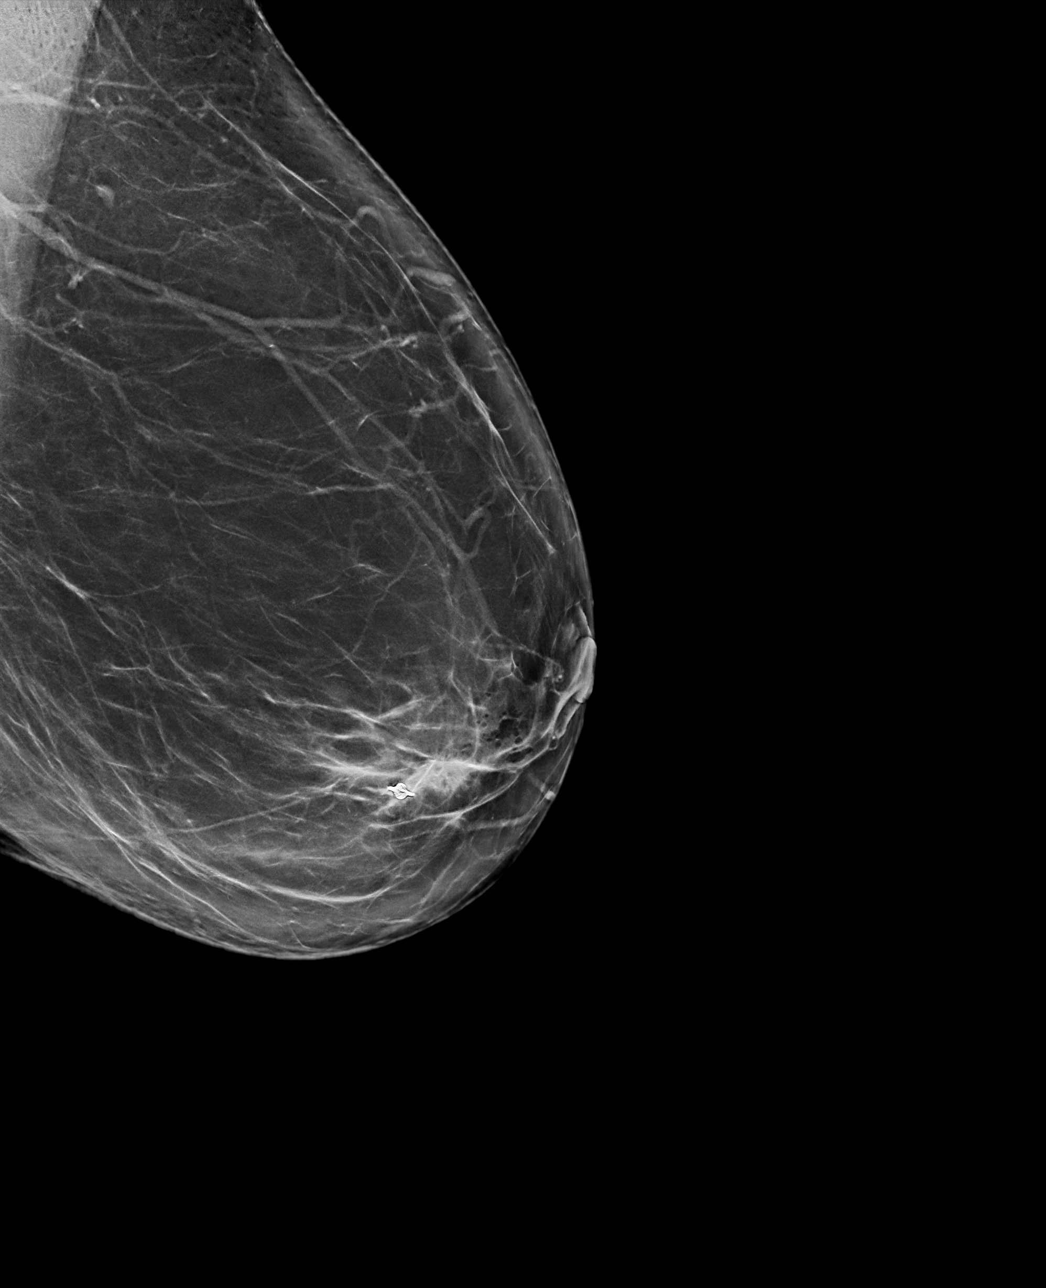

[L CC synth-2D]
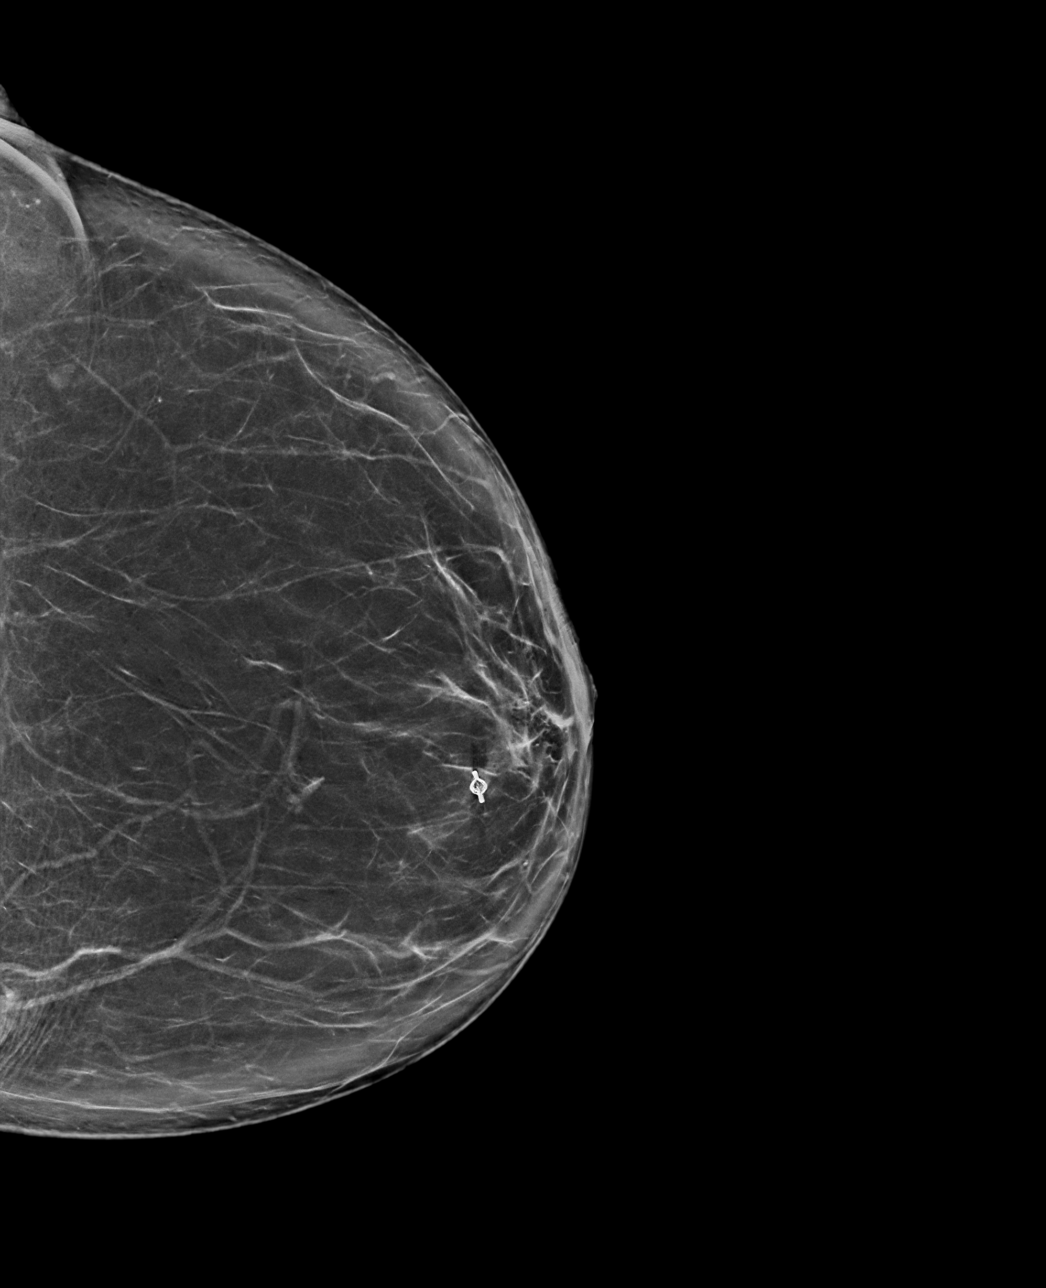

[L CC tomo · tomo slice 45/89.0]
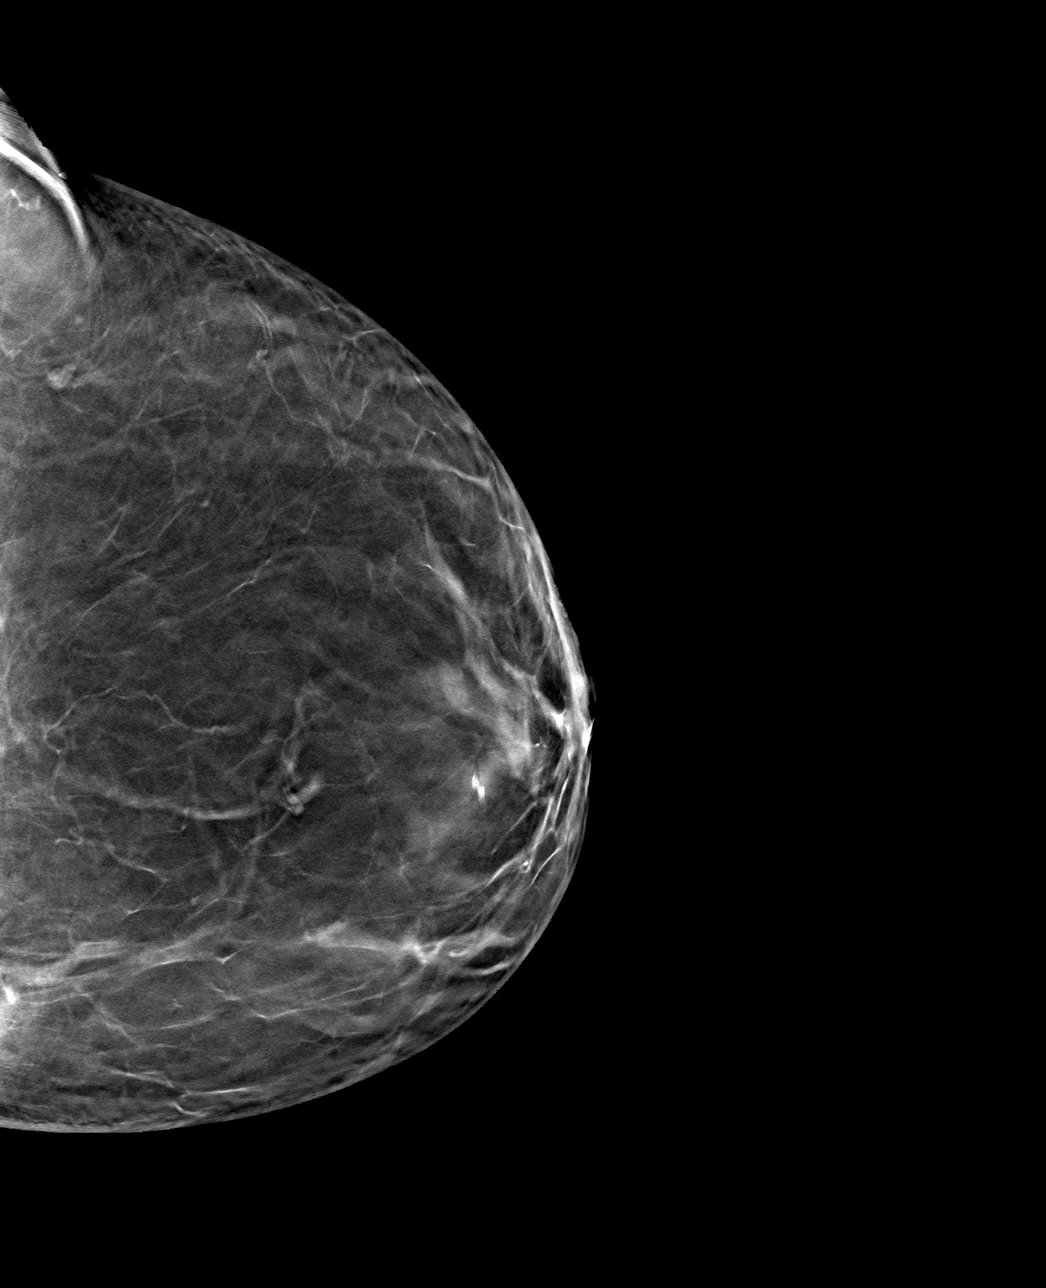

[L ML tomo · tomo slice 47/94.0]
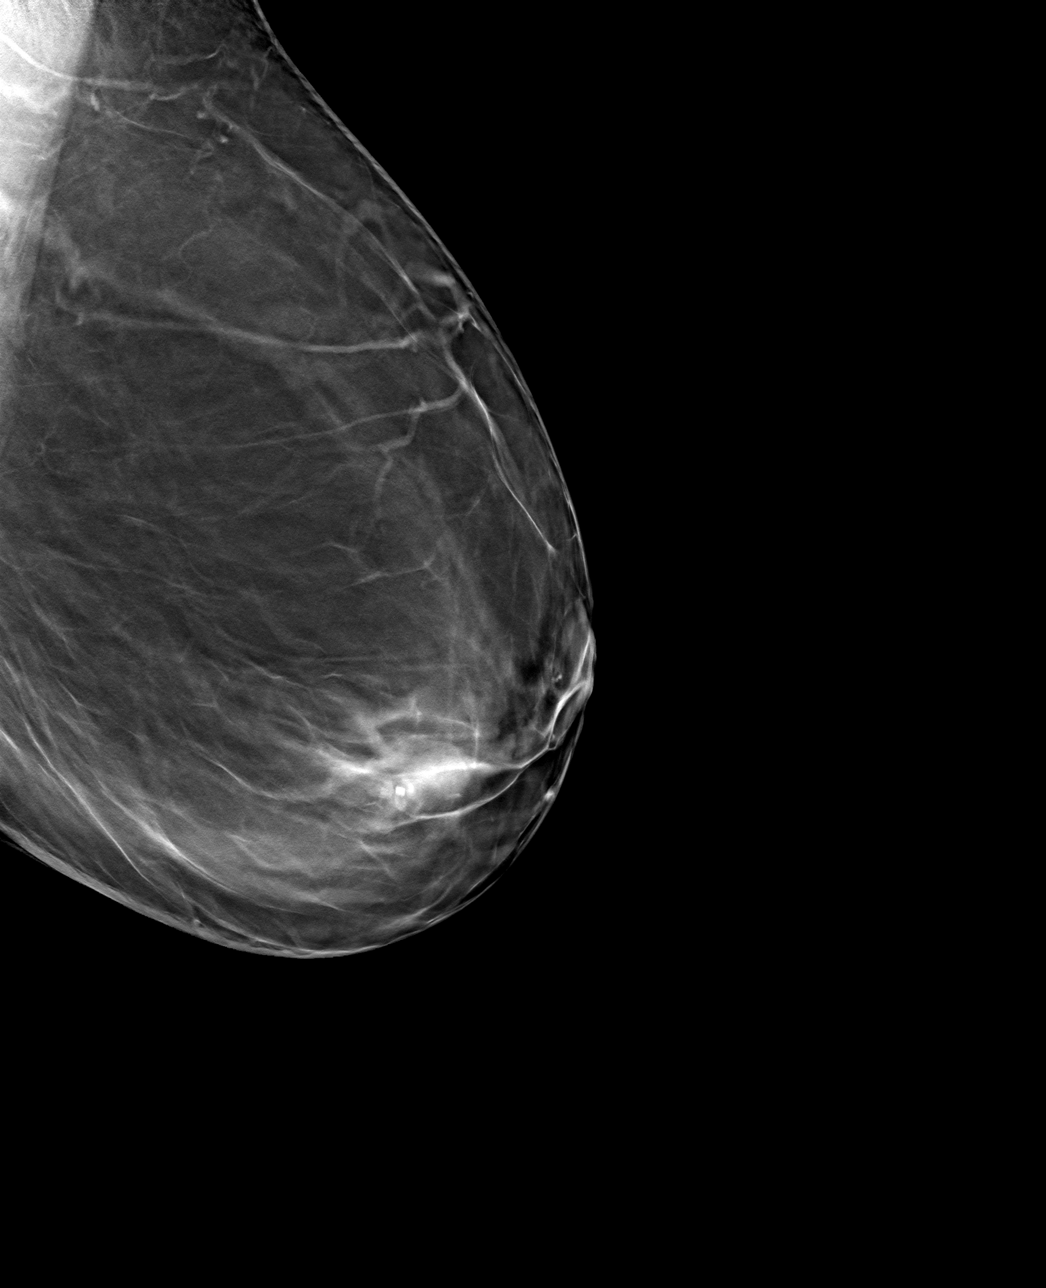

[4 of 12 positions shown; findings below may reference images not displayed]

FINDINGS: Mammographic images were obtained following ultrasound guided biopsy
of a mass in the left breast at 6 o'clock. The biopsy marking clip
is in expected position at the site of biopsy.
IMPRESSION: Appropriate positioning of the vision shaped biopsy marking clip at
the site of biopsy in the inferior retroareolar left breast.

Final Assessment: Post Procedure Mammograms for Marker Placement

## 2020-12-07 IMAGING — MG US BREAST BX W LOC DEV 1ST LESION IMG BX SPEC US GUIDE*L*
1 series · 8 of 8 positions shown · non-contrast
Comparison: Previous exam(s).
COMPARISON: Previous exam(s).

Addendum:
CLINICAL DATA: 74-year-old female presenting for ultrasound-guided
biopsy of a left breast mass.

EXAM:
ULTRASOUND GUIDED LEFT BREAST CORE NEEDLE BIOPSY

[Series 1: MG view · 0.06mm/px · 8 of 13 slices shown]
[im 1/13]
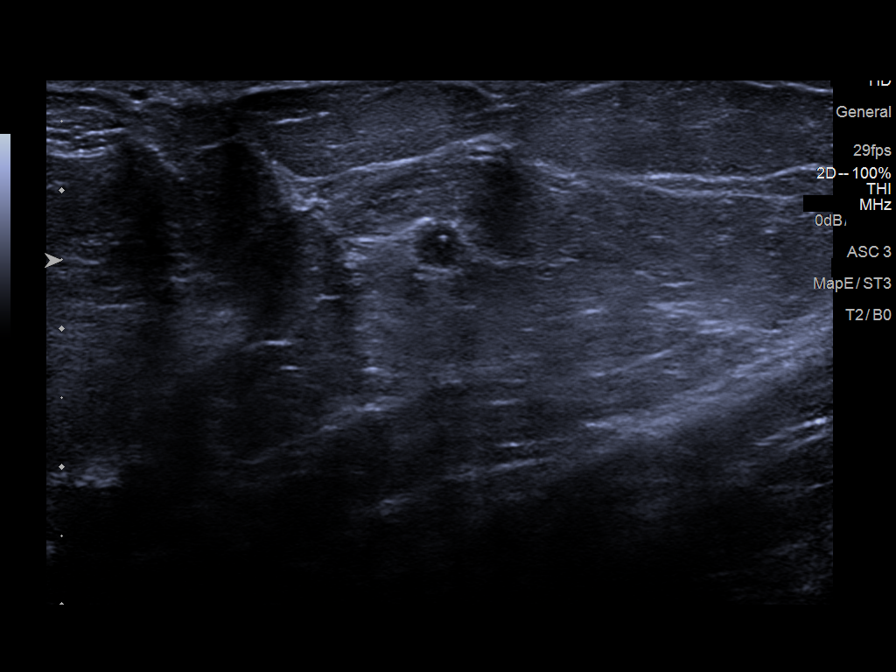
[im 2/13]
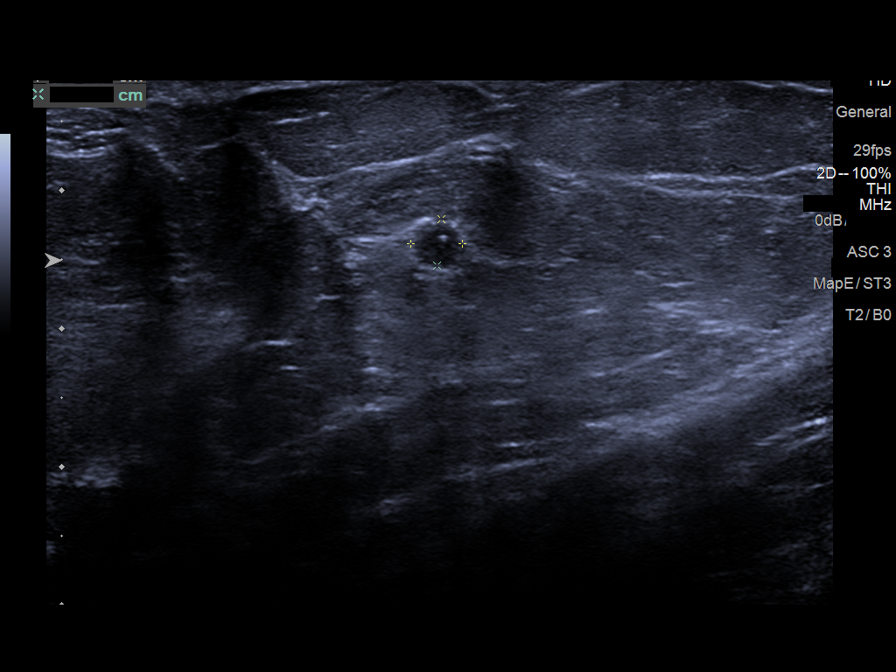
[im 4/13]
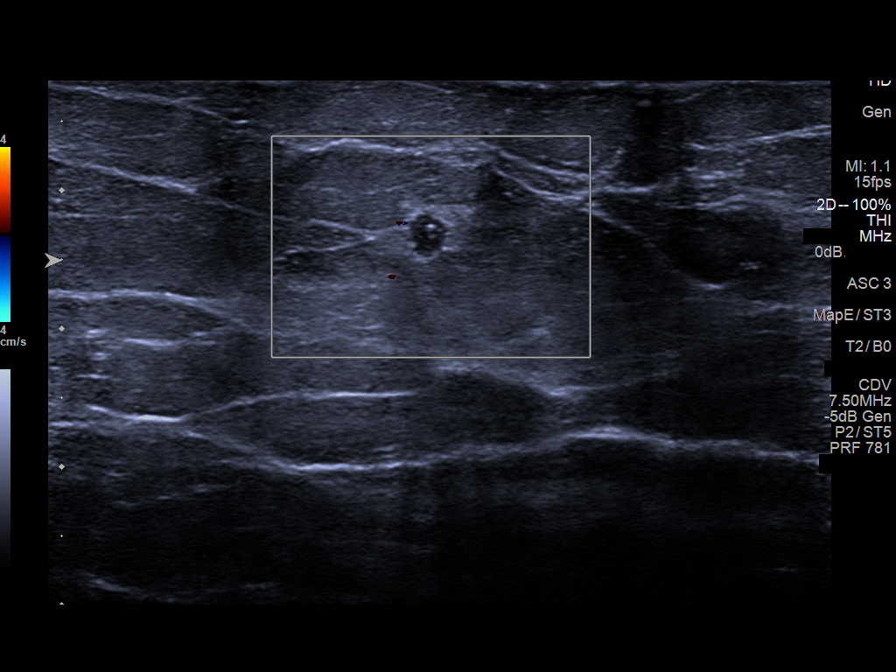
[im 6/13]
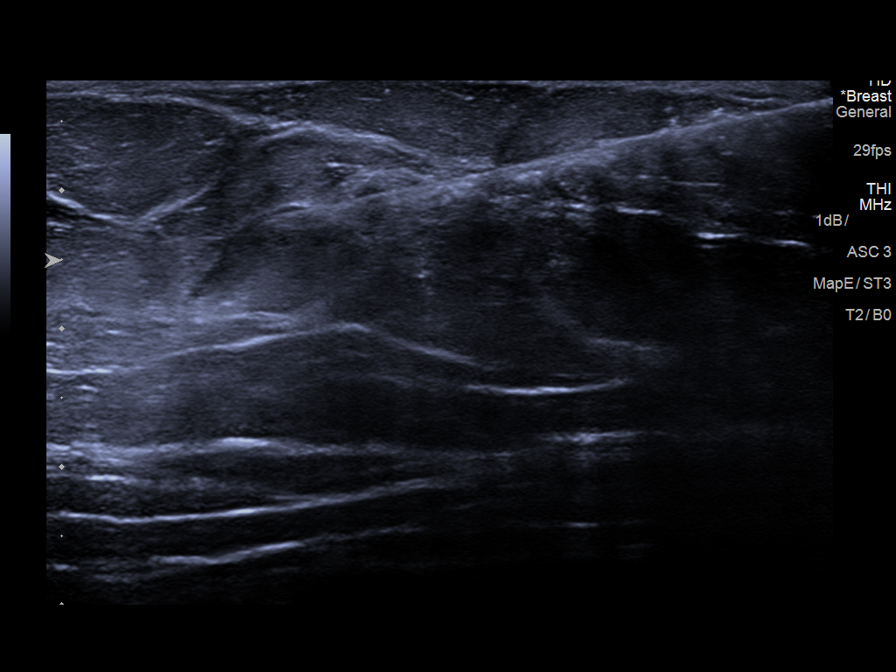
[im 7/13]
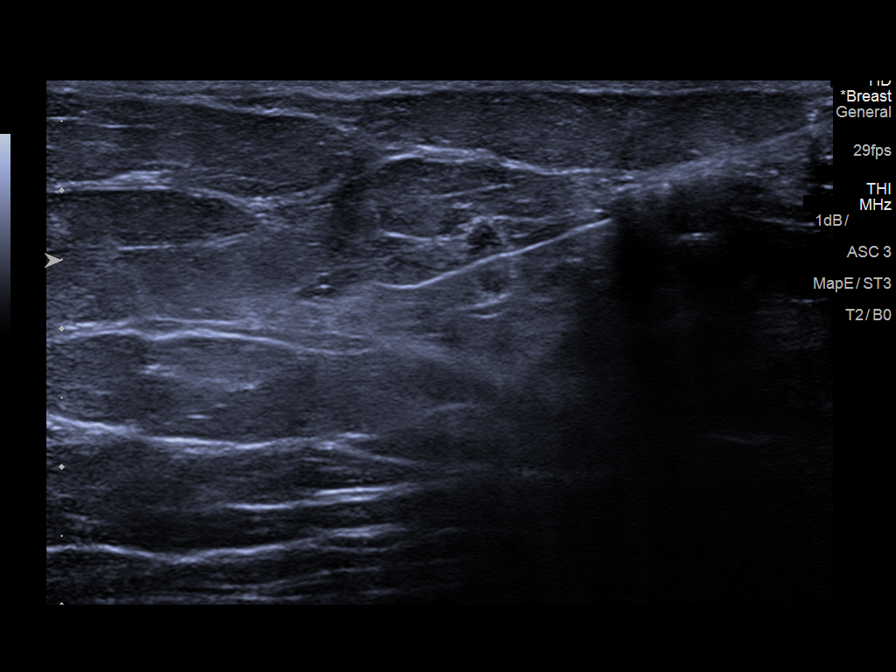
[im 9/13]
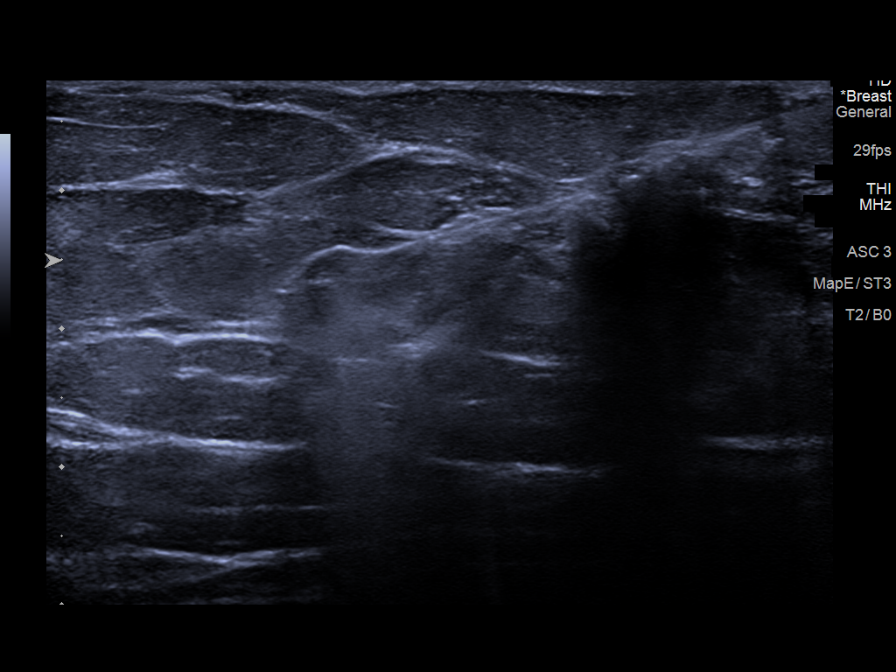
[im 11/13]
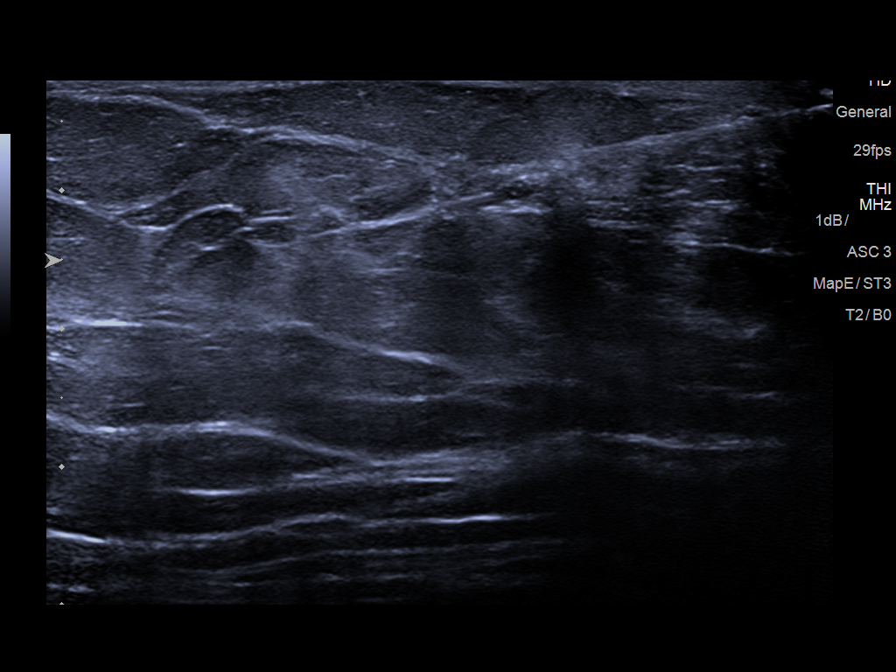
[im 13/13]
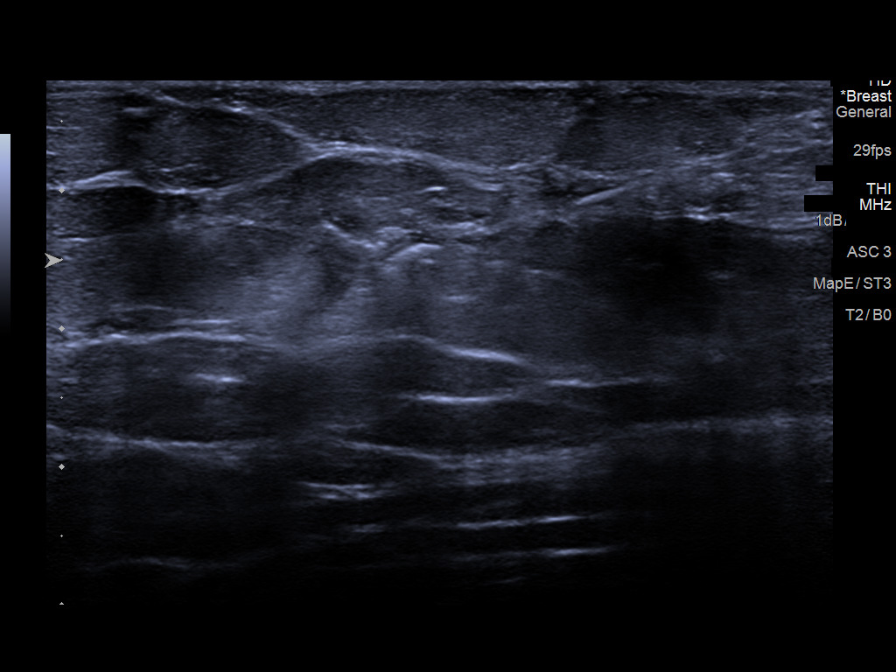

[8 of 8 positions shown; findings below may reference images not displayed]



Lesion quadrant: Lower inner quadrant

Using sterile technique and 1% Lidocaine as local anesthetic, under
direct ultrasound visualization, a 14 gauge Shaik Mohammed device was
used to perform biopsy of a mass in the left breast at 6 o'clock
using a lateral approach. At the conclusion of the procedure a
vision shaped tissue marker clip was deployed into the biopsy
cavity. Follow up 2 view mammogram was performed and dictated
separately.
IMPRESSION: Ultrasound guided biopsy of a left breast mass at 6 o'clock. No
apparent complications.

ADDENDUM:
PATHOLOGY revealed: A. BREAST, LEFT AT [DATE], 1 CM FROM THE NIPPLE;
ULTRASOUND-GUIDED CORE NEEDLE BIOPSY: - INVASIVE MAMMARY CARCINOMA,
NO SPECIAL TYPE. Size of invasive carcinoma: At least 4 mm in this
sample. Grade 3. Ductal carcinoma in situ: Not identified.
Lymphovascular invasion: Not identified.

Pathology results are CONCORDANT with imaging findings, per Dr.
Luz Esmeralda Encalada.

Pathology results and recommendations below were discussed with
patient by telephone on 08/06/2020. Patient reported biopsy site
within normal limits with slight tenderness at the site. Post biopsy
care instructions were reviewed, questions were answered and my
direct phone number was provided to patient. Patient was instructed
to call [HOSPITAL] if any concerns or questions arise
related to the biopsy.

Recommendation: Surgical consultation. Spoke with patient as she was
en route to [place] (which will be her permanent residence going
forward). I instructed patient to obtain oncological and surgical
consultation as soon as she arrives in [place]. I provided
patient with phone numbers for [HOSPITAL] and [HOSPITAL] for
her to request medical records and images be sent to her designated
providers in [place]. I also spoke with patient's sister
(Ngadzi Ghobe) in [place] and reiterated instructions given to
the patient. Sister (Penaldino Mara-Why) stated she will assist patient in
obtaining the appropriate providers needed to treat diagnosis stated
above. I provided patient and family with my phone number should any
concerns or questions arise to call me back.

Pathology results reported by Kiri Jim RN on 08/08/2020.



Lesion quadrant: Lower inner quadrant

Using sterile technique and 1% Lidocaine as local anesthetic, under
direct ultrasound visualization, a 14 gauge Shaik Mohammed device was
used to perform biopsy of a mass in the left breast at 6 o'clock
using a lateral approach. At the conclusion of the procedure a
vision shaped tissue marker clip was deployed into the biopsy
cavity. Follow up 2 view mammogram was performed and dictated
separately.
IMPRESSION: Ultrasound guided biopsy of a left breast mass at 6 o'clock. No
apparent complications.
